# Patient Record
Sex: Female | Born: 1945 | Race: White | Hispanic: No | Smoking: Former smoker
Health system: Southern US, Community
[De-identification: ages and names within clinical notes are randomized; demographics above are authoritative.]

## PROBLEM LIST (undated history)

## (undated) DIAGNOSIS — Z972 Presence of dental prosthetic device (complete) (partial): Secondary | ICD-10-CM

## (undated) DIAGNOSIS — J302 Other seasonal allergic rhinitis: Secondary | ICD-10-CM

## (undated) DIAGNOSIS — J189 Pneumonia, unspecified organism: Secondary | ICD-10-CM

## (undated) DIAGNOSIS — K219 Gastro-esophageal reflux disease without esophagitis: Secondary | ICD-10-CM

## (undated) DIAGNOSIS — J439 Emphysema, unspecified: Secondary | ICD-10-CM

## (undated) DIAGNOSIS — K8050 Calculus of bile duct without cholangitis or cholecystitis without obstruction: Secondary | ICD-10-CM

## (undated) DIAGNOSIS — M1990 Unspecified osteoarthritis, unspecified site: Secondary | ICD-10-CM

## (undated) DIAGNOSIS — J45909 Unspecified asthma, uncomplicated: Secondary | ICD-10-CM

## (undated) DIAGNOSIS — M810 Age-related osteoporosis without current pathological fracture: Secondary | ICD-10-CM

## (undated) DIAGNOSIS — G709 Myoneural disorder, unspecified: Secondary | ICD-10-CM

## (undated) DIAGNOSIS — J42 Unspecified chronic bronchitis: Secondary | ICD-10-CM

## (undated) HISTORY — PX: COLECTOMY: SHX59

## (undated) HISTORY — PX: SALPINGOOPHORECTOMY: SHX82

## (undated) HISTORY — DX: Unspecified osteoarthritis, unspecified site: M19.90

## (undated) HISTORY — DX: Emphysema, unspecified: J43.9

## (undated) HISTORY — PX: SMALL INTESTINE SURGERY: SHX150

## (undated) HISTORY — DX: Myoneural disorder, unspecified: G70.9

## (undated) HISTORY — PX: APPENDECTOMY: SHX54

## (undated) HISTORY — DX: Unspecified asthma, uncomplicated: J45.909

## (undated) HISTORY — DX: Age-related osteoporosis without current pathological fracture: M81.0

---

## 1985-05-25 HISTORY — PX: BREAST BIOPSY: SHX20

## 1998-01-07 ENCOUNTER — Other Ambulatory Visit: Admission: RE | Admit: 1998-01-07 | Discharge: 1998-01-07 | Payer: Self-pay | Admitting: Family Medicine

## 1999-01-22 ENCOUNTER — Other Ambulatory Visit: Admission: RE | Admit: 1999-01-22 | Discharge: 1999-01-22 | Payer: Self-pay | Admitting: Family Medicine

## 2015-05-01 ENCOUNTER — Ambulatory Visit (INDEPENDENT_AMBULATORY_CARE_PROVIDER_SITE_OTHER): Payer: 59 | Admitting: Emergency Medicine

## 2015-05-01 VITALS — BP 120/70 | HR 70 | Temp 98.1°F | Resp 18 | Ht 60.0 in | Wt 130.0 lb

## 2015-05-01 DIAGNOSIS — D649 Anemia, unspecified: Secondary | ICD-10-CM

## 2015-05-01 DIAGNOSIS — M069 Rheumatoid arthritis, unspecified: Secondary | ICD-10-CM | POA: Diagnosis not present

## 2015-05-01 DIAGNOSIS — M2540 Effusion, unspecified joint: Secondary | ICD-10-CM | POA: Diagnosis not present

## 2015-05-01 DIAGNOSIS — N3000 Acute cystitis without hematuria: Secondary | ICD-10-CM

## 2015-05-01 DIAGNOSIS — M7989 Other specified soft tissue disorders: Secondary | ICD-10-CM

## 2015-05-01 LAB — POCT URINALYSIS DIP (MANUAL ENTRY)
BILIRUBIN UA: NEGATIVE
Bilirubin, UA: NEGATIVE
Blood, UA: NEGATIVE
GLUCOSE UA: NEGATIVE
Nitrite, UA: POSITIVE — AB
PROTEIN UA: NEGATIVE
SPEC GRAV UA: 1.015
Urobilinogen, UA: 0.2
pH, UA: 7

## 2015-05-01 LAB — COMPLETE METABOLIC PANEL WITH GFR
ALBUMIN: 3.3 g/dL — AB (ref 3.6–5.1)
ALK PHOS: 114 U/L (ref 33–130)
ALT: 6 U/L (ref 6–29)
AST: 10 U/L (ref 10–35)
BILIRUBIN TOTAL: 0.5 mg/dL (ref 0.2–1.2)
BUN: 13 mg/dL (ref 7–25)
CALCIUM: 9.2 mg/dL (ref 8.6–10.4)
CO2: 25 mmol/L (ref 20–31)
CREATININE: 0.73 mg/dL (ref 0.60–0.93)
Chloride: 102 mmol/L (ref 98–110)
GFR, EST NON AFRICAN AMERICAN: 84 mL/min (ref >=60)
Glucose, Bld: 82 mg/dL (ref 65–99)
Potassium: 4.8 mmol/L (ref 3.5–5.3)
Sodium: 136 mmol/L (ref 135–146)
TOTAL PROTEIN: 7.3 g/dL (ref 6.1–8.1)

## 2015-05-01 LAB — POCT CBC
Granulocyte percent: 55.2 %G (ref 37–80)
HEMATOCRIT: 31.5 % — AB (ref 37.7–47.9)
HEMOGLOBIN: 10.2 g/dL — AB (ref 12.2–16.2)
LYMPH, POC: 0.9 (ref 0.6–3.4)
MCH, POC: 21.3 pg — AB (ref 27–31.2)
MCHC: 32.4 g/dL (ref 31.8–35.4)
MCV: 65.9 fL — AB (ref 80–97)
MID (CBC): 0.3 (ref 0–0.9)
MPV: 5.9 fL (ref 0–99.8)
POC GRANULOCYTE: 1.5 — AB (ref 2–6.9)
POC LYMPH %: 34.3 % (ref 10–50)
POC MID %: 10.5 % (ref 0–12)
Platelet Count, POC: 407 10*3/uL (ref 142–424)
RBC: 4.78 M/uL (ref 4.04–5.48)
RDW, POC: 19 %
WBC: 2.7 10*3/uL — AB (ref 4.6–10.2)

## 2015-05-01 LAB — TSH: TSH: 1.258 u[IU]/mL (ref 0.350–4.500)

## 2015-05-01 LAB — POC MICROSCOPIC URINALYSIS (UMFC)

## 2015-05-01 LAB — IRON AND TIBC
%SAT: 12 % (ref 11–50)
IRON: 26 ug/dL — AB (ref 45–160)
TIBC: 217 ug/dL — ABNORMAL LOW (ref 250–450)
UIBC: 191 ug/dL (ref 125–400)

## 2015-05-01 MED ORDER — CEPHALEXIN 250 MG PO CAPS: 250.0000 mg | ORAL_CAPSULE | Freq: Three times a day (TID) | ORAL | Status: AC

## 2015-05-01 MED ORDER — TRAMADOL HCL ER 100 MG PO TB24: 100.0000 mg | ORAL_TABLET | Freq: Every day | ORAL | Status: DC

## 2015-05-01 MED ORDER — SULINDAC 150 MG PO TABS: 150.0000 mg | ORAL_TABLET | Freq: Two times a day (BID) | ORAL | Status: DC

## 2015-05-01 NOTE — Patient Instructions (Addendum)
I am requesting home health aide at this time.  Please await contact for this information, as well as for rheumatology appointment, and for the doppler of her lower extremities.   I will look at your lab results and follow up with you regarding a diuretic use.   We will start you on an antibiotic at this time.  Follow this prescription. You can attempt to pick up the compression stockings at [facility].

## 2015-05-01 NOTE — Progress Notes (Signed)
[facility] [address] [place] [address] [phone]  Date:  05/01/2015   Name:  Sandra Schwartz   DOB:  1946/01/24   MRN:  [id number]  PCP:  No PCP Per Patient    History of Present Illness:  Sandra Schwartz is a 69 y.o. female patient with pmh of rheumatoid arthritis who presents to [facility] for cc of swelling of feet and hands.  She is here today with daughter. Patient reports that her swelling of her feet and elbows have occurred for 5 years, but has recently worsened.  Patient recently travelled 3 days ago from [place] to move in with daughter.  She was living alone, but daily living had grown difficult.  She no longer drives.  She is not able to open cans and cook, and dress herself, due to the deformity of her hands.  Within the last 2 weeks, walking has become very difficult.  She has tingling of her lower extremity.  Hurts to walk.  She has no sob or dyspnea.  There is no chest pains, palpitations, or dizziness.   . Rheumatoid arthritis treated with clinoril.  She states that she had prednisone for a stint, but developed pneumonia, and this was discontinued.    When asked about congestive heart failure, patient and daughter have never heard this.  She was on a diuretic at a time, but developed dizziness--so this was discontinued.  She currently does not use any compression stockings. No known hx of blood clots.   She reports no blood in stool, but last week, she recalls her stool being very dark. Removed 1 ft of colon due to blockage, 5 years ago.  No personal or familial hx of gi cancers.   Daughter scheduled appt with a primary care on 05/13/2015 with [facility].  Past primary care was [facility] [place], [place] [address], phone: [phone]  No urinary pain, dysuria, hematuria, or frequency at this time.  She does have a hx of urinary tract infection.    There are no active problems to display for this patient.   Past Medical History   Diagnosis Date  . Allergy   . Arthritis   . Asthma   . CHF (congestive heart failure) ([place])   . Emphysema of lung ([place])   . Neuromuscular disorder ([place])   . Osteoporosis     Past Surgical History  Procedure Laterality Date  . Appendectomy    . Colon surgery    . Small intestine surgery      Social History  Substance Use Topics  . Smoking status: Former Smoker    Quit date: 05/01/2007  . Smokeless tobacco: None  . Alcohol Use: No    History reviewed. No pertinent family history.  Allergies  Allergen Reactions  . Codeine Rash    Medication list has been reviewed and updated.  No current outpatient prescriptions on file prior to visit.   No current facility-administered medications on file prior to visit.    ROS ROS otherwise unremarkable unless listed above.  Physical Examination: BP 120/70 mmHg  Pulse 70  Temp(Src) 98.1 F (36.7 C) (Oral)  Resp 18  Ht 5' (1.524 m)  Wt 130 lb (58.968 kg)  BMI 25.39 kg/m2  SpO2 97% Ideal Body Weight: Weight in (lb) to have BMI = 25: 127.7  Physical Exam  Constitutional: She is oriented to person, place, and time. She appears well-developed and well-nourished. No distress.  HENT:  Head: Normocephalic and atraumatic.  Right Ear: External ear normal.  Left Ear: External ear normal.  Eyes: Conjunctivae and EOM are normal. Pupils are equal, round, and reactive to light.  Cardiovascular: Normal rate.  Exam reveals no gallop and no friction rub.   Murmur heard.  Systolic murmur is present with a grade of 1/6  Pulses:      Radial pulses are 2+ on the right side, and 2+ on the left side.       Dorsalis pedis pulses are 1+ on the right side, and 1+ on the left side.  Pitting edema.  Pulmonary/Chest: Effort normal. No respiratory distress.  Musculoskeletal:  mcp swelling with ulnar deviation consistent with RA.  Neurological: She is alert and oriented to person, place, and time.  Skin: She is not diaphoretic.  Psychiatric:  She has a normal mood and affect. Her behavior is normal.     Results for orders placed or performed in visit on 05/01/15  POCT CBC  Result Value Ref Range   WBC 2.7 (A) 4.6 - 10.2 K/uL   Lymph, poc 0.9 0.6 - 3.4   POC LYMPH PERCENT 34.3 10 - 50 %L   MID (cbc) 0.3 0 - 0.9   POC MID % 10.5 0 - 12 %M   POC Granulocyte 1.5 (A) 2 - 6.9   Granulocyte percent 55.2 37 - 80 %G   RBC 4.78 4.04 - 5.48 M/uL   Hemoglobin 10.2 (A) 12.2 - 16.2 g/dL   HCT, POC 31.5 (A) 37.7 - 47.9 %   MCV 65.9 (A) 80 - 97 fL   MCH, POC 21.3 (A) 27 - 31.2 pg   MCHC 32.4 31.8 - 35.4 g/dL   RDW, POC 19.0 %   Platelet Count, POC 407 142 - 424 K/uL   MPV 5.9 0 - 99.8 fL  POCT urinalysis dipstick  Result Value Ref Range   Color, UA orange (A) yellow   Clarity, UA cloudy (A) clear   Glucose, UA negative negative   Bilirubin, UA negative negative   Ketones, POC UA negative negative   Spec Grav, UA 1.015    Blood, UA negative negative   pH, UA 7.0    Protein Ur, POC negative negative   Urobilinogen, UA 0.2    Nitrite, UA Positive (A) Negative   Leukocytes, UA small (1+) (A) Negative  POCT Microscopic Urinalysis ([facility])  Result Value Ref Range   WBC,UR,HPF,POC Moderate (A) None WBC/hpf   RBC,UR,HPF,POC None None RBC/hpf   Bacteria Many (A) None, Too numerous to count   Mucus Present (A) Absent   Epithelial Cells, UR Per Microscopy Few (A) None, Too numerous to count cells/hpf   Amorphous present    EKG normal.  Assessment and Plan: Sandra Schwartz is a 69 y.o. female who is here today for cc of foot and hand swelling.   Pitting edema --BP normal, and concern that diuretic may be too harsh at this time. Treating urinary tract infection.  Urine culture sent. Edema of lower extremity--will schedule doppler for clotting.  This swelling could be secondary to her under treated rheumatoid arthritis.  Will rule out CHF at this time.   Anemia possibly secondary to her RA.  Will advise fecal occult to rule out GI  bleed. Rheumatology consult appreciated.  She likely could use disease modifying drug, but compilation of age and severity of illness, may effect treatment plan.   She declines flu and pneumococcal vaccine at this time.  Will retrieve vaccine record to see what vaccines  have been done.  This will be important to her treatment selection.   Home health aide referral appreciated, to see to current needs Rheumatoid arthritis involving multiple sites, unspecified rheumatoid factor presence ([place]) - Plan: Ambulatory referral to Rheumatology, POCT Microscopic Urinalysis ([facility]), Ambulatory referral to [place], traMADol (ULTRAM-ER) 100 MG 24 hr tablet, sulindac (CLINORIL) 150 MG tablet  Swelling of lower extremity - Plan: POCT CBC, COMPLETE METABOLIC PANEL WITH GFR, EKG 12-Lead, POCT SEDIMENTATION RATE, TSH, VAS [place] LOWER EXTREMITY VENOUS (DVT), POCT urinalysis dipstick, Ambulatory referral to [place], Brain natriuretic peptide  Joint swelling - Plan: POCT SEDIMENTATION RATE, VAS [place] LOWER EXTREMITY VENOUS (DVT), Ambulatory referral to Home Health  Low hemoglobin - Plan: Iron and TIBC  Acute cystitis without hematuria - Plan: cephALEXin (KEFLEX) 250 MG capsule, Urine culture   Ivar Drape, PA-C [facility] 05/01/2015 2:57 PM

## 2015-05-02 LAB — BRAIN NATRIURETIC PEPTIDE: BRAIN NATRIURETIC PEPTIDE: 42.3 pg/mL (ref 0.0–100.0)

## 2015-05-03 ENCOUNTER — Telehealth: Payer: Self-pay | Admitting: Physician Assistant

## 2015-05-03 NOTE — Telephone Encounter (Signed)
Please fax a permission to release information for her medical record.  Patient is moving here.  Please mark as urgent.  [facility] [place], [place] [address] phone number: [phone]

## 2015-05-03 NOTE — Telephone Encounter (Signed)
Request for records faxed with confirmation.

## 2015-05-04 LAB — URINE CULTURE: Colony Count: >=100000

## 2015-05-07 ENCOUNTER — Ambulatory Visit (HOSPITAL_COMMUNITY)
Admission: RE | Admit: 2015-05-07 | Discharge: 2015-05-07 | Disposition: A | Discharge status: Home / Self Care | Payer: Medicaid - Out of State | Source: Ambulatory Visit | Attending: Physician Assistant | Admitting: Physician Assistant

## 2015-05-07 ENCOUNTER — Telehealth: Payer: Self-pay

## 2015-05-07 DIAGNOSIS — M2540 Effusion, unspecified joint: Secondary | ICD-10-CM | POA: Diagnosis not present

## 2015-05-07 DIAGNOSIS — M7122 Synovial cyst of popliteal space [Baker], left knee: Secondary | ICD-10-CM | POA: Insufficient documentation

## 2015-05-07 DIAGNOSIS — M7989 Other specified soft tissue disorders: Secondary | ICD-10-CM | POA: Insufficient documentation

## 2015-05-07 DIAGNOSIS — D649 Anemia, unspecified: Secondary | ICD-10-CM

## 2015-05-07 NOTE — Telephone Encounter (Signed)
[facility] called to deliver results of bilateral venous duplex. Negative for DVT bilaterally.  She has a Baker's cyst on the left popliteal focca.

## 2015-05-07 NOTE — Progress Notes (Signed)
VASCULAR LAB PRELIMINARY  PRELIMINARY  PRELIMINARY  PRELIMINARY  Bilateral lower extremity venous duplex  completed.    Preliminary report:  Bilateral:  No evidence of DVT or superficial thrombosis.  Left:  Baker's cyst noted in the popliteal fossa.    Callan Norden, RVT 05/07/2015, 9:42 AM

## 2015-05-08 ENCOUNTER — Telehealth: Payer: Self-pay | Admitting: Family Medicine

## 2015-05-08 MED ORDER — FUROSEMIDE 20 MG PO TABS: 20.0000 mg | ORAL_TABLET | ORAL | Status: DC

## 2015-05-08 NOTE — Telephone Encounter (Signed)
Merry Proud, nurse with [facility], called to get verabal orders until patient see's PCP Monday. Verbal order given for skilled nursing for disease management, medication education, PT for gait and strength, occupational therapy for arthritis in hands, and tele-monitoring equipment.  Merry Proud # [phone]

## 2015-05-08 NOTE — Telephone Encounter (Signed)
Spoke with patient's daughter who was present at the appointment.  She states that the swelling is going down a lot, but there is still swelling present.  I am advising that they take 20mg  every other day.   I have asked that they return for a cbc and ferritin level within the next week as a labs only.  Also advised that while they are there, they pick up a stool occult which was ordered at last visit.  Please give to patient.   Rheumatology visit tomorrow.  Advised that if rheumatologist states to disregard the lasix, this is fine.   She will have new pcp appt. On 05/13/2015

## 2015-05-09 ENCOUNTER — Other Ambulatory Visit (INDEPENDENT_AMBULATORY_CARE_PROVIDER_SITE_OTHER): Payer: 59

## 2015-05-09 DIAGNOSIS — D649 Anemia, unspecified: Secondary | ICD-10-CM | POA: Diagnosis not present

## 2015-05-09 LAB — FERRITIN: Ferritin: 241 ng/mL (ref 10–291)

## 2015-05-09 LAB — CBC
HCT: 31.3 % — ABNORMAL LOW (ref 36.0–46.0)
HEMOGLOBIN: 10.1 g/dL — AB (ref 12.0–15.0)
MCH: 21.5 pg — AB (ref 26.0–34.0)
MCHC: 32.3 g/dL (ref 30.0–36.0)
MCV: 66.7 fL — AB (ref 78.0–100.0)
MPV: 7.9 fL — ABNORMAL LOW (ref 8.6–12.4)
Platelets: 335 10*3/uL (ref 150–400)
RBC: 4.69 MIL/uL (ref 3.87–5.11)
RDW: 19.2 % — ABNORMAL HIGH (ref 11.5–15.5)
WBC: 3.2 10*3/uL — ABNORMAL LOW (ref 4.0–10.5)

## 2015-05-09 NOTE — Telephone Encounter (Signed)
Agree to verbal order for all home health needs as outlined.

## 2015-05-13 ENCOUNTER — Ambulatory Visit: Payer: Self-pay | Admitting: Internal Medicine

## 2015-05-23 LAB — POC HEMOCCULT BLD/STL (HOME/3-CARD/SCREEN)
Card #2 Fecal Occult Blod, POC: NEGATIVE
Card #3 Fecal Occult Blood, POC: NEGATIVE
Fecal Occult Blood, POC: NEGATIVE

## 2015-06-05 ENCOUNTER — Telehealth: Payer: Self-pay

## 2015-06-05 NOTE — Telephone Encounter (Signed)
Marlowe Kays from [facility] from home health would like to get an order for pt to have Occup therapy for once a week for 2 weeks. Please call [phone]

## 2015-06-06 ENCOUNTER — Ambulatory Visit: Payer: 59

## 2015-06-06 NOTE — Telephone Encounter (Signed)
Please go ahead and refer to occupational therapy for 2 weeks--

## 2015-06-07 NOTE — Telephone Encounter (Signed)
Spoke with Marlowe Kays. Gave verbal orders.

## 2015-06-13 ENCOUNTER — Encounter: Payer: Self-pay | Admitting: Physician Assistant

## 2015-06-13 ENCOUNTER — Ambulatory Visit: Payer: 59 | Admitting: Internal Medicine

## 2015-06-13 DIAGNOSIS — E785 Hyperlipidemia, unspecified: Secondary | ICD-10-CM | POA: Insufficient documentation

## 2015-06-13 DIAGNOSIS — N952 Postmenopausal atrophic vaginitis: Secondary | ICD-10-CM | POA: Insufficient documentation

## 2015-06-13 DIAGNOSIS — F411 Generalized anxiety disorder: Secondary | ICD-10-CM | POA: Insufficient documentation

## 2015-06-13 DIAGNOSIS — J449 Chronic obstructive pulmonary disease, unspecified: Secondary | ICD-10-CM | POA: Insufficient documentation

## 2015-06-13 DIAGNOSIS — M069 Rheumatoid arthritis, unspecified: Secondary | ICD-10-CM | POA: Insufficient documentation

## 2015-06-13 DIAGNOSIS — E079 Disorder of thyroid, unspecified: Secondary | ICD-10-CM | POA: Insufficient documentation

## 2015-06-13 DIAGNOSIS — I10 Essential (primary) hypertension: Secondary | ICD-10-CM | POA: Insufficient documentation

## 2015-06-25 ENCOUNTER — Telehealth: Payer: Self-pay

## 2015-06-25 NOTE — Telephone Encounter (Signed)
Sandra Schwartz from [facility] is calling to request an order for cardio pulmonary care twice a week. She states that the program is almost over and the insurance has changes. Tamsen Meek: [phone]

## 2015-06-25 NOTE — Telephone Encounter (Signed)
She had a visit with [facility] planned before she ever saw [place].  They follow her care.  This should be their orders, and we should assist them in getting this ordered through them, and with a change in insurance--this is a good time for this transition.  Contact [facility], and let them know their desires.  In this way, a continuity of care will be done as this is her primary from what I understand.

## 2015-06-27 ENCOUNTER — Ambulatory Visit (INDEPENDENT_AMBULATORY_CARE_PROVIDER_SITE_OTHER): Payer: Medicare Other | Admitting: Internal Medicine

## 2015-06-27 ENCOUNTER — Encounter: Payer: Self-pay | Admitting: Internal Medicine

## 2015-06-27 VITALS — BP 130/70 | HR 77 | Temp 98.3°F | Resp 12 | Ht 61.0 in | Wt 134.1 lb

## 2015-06-27 DIAGNOSIS — M069 Rheumatoid arthritis, unspecified: Secondary | ICD-10-CM | POA: Diagnosis not present

## 2015-06-27 DIAGNOSIS — J42 Unspecified chronic bronchitis: Secondary | ICD-10-CM

## 2015-06-27 MED ORDER — TRAMADOL HCL ER 100 MG PO TB24
100.0000 mg | ORAL_TABLET | Freq: Every day | ORAL | Status: DC
Start: 2015-06-27 — End: 2016-06-19

## 2015-06-27 MED ORDER — DOXYCYCLINE HYCLATE 100 MG PO TABS: 100.0000 mg | ORAL_TABLET | Freq: Two times a day (BID) | ORAL | Status: DC

## 2015-06-27 NOTE — Patient Instructions (Signed)
We have given you the prescription for the tramadol.   We have also sent in the antibiotic for the sinuses called doxycycline 1 pill twice a day for 10 days to clear the sinuses.   You can also use a medicine over the counter called zyrtec (cetirizine).

## 2015-06-27 NOTE — Progress Notes (Signed)
Pre visit review using our clinic review tool, if applicable. No additional management support is needed unless otherwise documented below in the visit note. 

## 2015-06-28 ENCOUNTER — Encounter: Payer: Self-pay | Admitting: Internal Medicine

## 2015-06-28 NOTE — Progress Notes (Signed)
   Subjective:    Patient ID: Sandra Schwartz, female    DOB: 1945/12/04, 70 y.o.   MRN: [id number]  HPI The patient is a new 70 YO female coming in for her rheumatoid arthritis. She has seen rheumatology but there was some discrepancy with her insurance and she was delayed in starting DMARD. She is on prednisone right now. She was having severe hand pain. This is somewhat better now. She does have deformity from years of disease. Usually an NSAID will help with her pain but she is not allowed to use that while on prednisone.   PMH, [facility], social history reviewed and updated.   Review of Systems  Constitutional: Positive for activity change. Negative for fever, chills, appetite change, fatigue and unexpected weight change.  HENT: Negative.   Eyes: Negative.   Respiratory: Negative for cough, chest tightness, shortness of breath and wheezing.   Cardiovascular: Negative for chest pain, palpitations and leg swelling.  Gastrointestinal: Negative for nausea, abdominal pain, diarrhea, constipation and abdominal distention.  Musculoskeletal: Positive for joint swelling and arthralgias. Negative for myalgias, back pain and gait problem.  Skin: Negative.   Neurological: Negative.   Psychiatric/Behavioral: Negative.       Objective:   Physical Exam  Constitutional: She is oriented to person, place, and time. She appears well-developed and well-nourished.  HENT:  Head: Normocephalic and atraumatic.  Eyes: EOM are normal.  Neck: Normal range of motion.  Cardiovascular: Normal rate and regular rhythm.   Pulmonary/Chest: Effort normal and breath sounds normal. No respiratory distress. She has no wheezes.  Abdominal: Soft. Bowel sounds are normal. She exhibits no distension. There is no tenderness. There is no rebound.  Musculoskeletal: She exhibits tenderness. She exhibits no edema.  Stigmata of RA hand changes  Neurological: She is alert and oriented to person, place, and time. Coordination normal.    Skin: Skin is warm and dry.  Psychiatric: She has a normal mood and affect.   Filed Vitals:   06/27/15 1402  BP: 130/70  Pulse: 77  Temp: 98.3 F (36.8 C)  TempSrc: Oral  Resp: 12  Height: 5\' 1"  (1.549 m)  Weight: 134 lb 1.9 oz (60.836 kg)  SpO2: 98%      Assessment & Plan:

## 2015-06-28 NOTE — Assessment & Plan Note (Signed)
Some flare today and rx for doxycycline 10 days to help. Already on prednisone for her RA. Has rescue inhalers and if more flares would need maintenance meds.

## 2015-06-28 NOTE — Assessment & Plan Note (Signed)
She will keep follow up with rheumatology for her RA and DMARD. Refill of tramadol ER until she can see them back.

## 2015-07-02 DIAGNOSIS — I509 Heart failure, unspecified: Secondary | ICD-10-CM | POA: Diagnosis not present

## 2015-07-02 DIAGNOSIS — M0589 Other rheumatoid arthritis with rheumatoid factor of multiple sites: Secondary | ICD-10-CM | POA: Diagnosis not present

## 2015-07-02 DIAGNOSIS — I0981 Rheumatic heart failure: Secondary | ICD-10-CM | POA: Diagnosis not present

## 2015-07-02 DIAGNOSIS — J439 Emphysema, unspecified: Secondary | ICD-10-CM | POA: Diagnosis not present

## 2015-07-05 ENCOUNTER — Telehealth: Payer: Self-pay | Admitting: Internal Medicine

## 2015-07-05 DIAGNOSIS — J439 Emphysema, unspecified: Secondary | ICD-10-CM | POA: Diagnosis not present

## 2015-07-05 DIAGNOSIS — I0981 Rheumatic heart failure: Secondary | ICD-10-CM | POA: Diagnosis not present

## 2015-07-05 DIAGNOSIS — M0589 Other rheumatoid arthritis with rheumatoid factor of multiple sites: Secondary | ICD-10-CM | POA: Diagnosis not present

## 2015-07-05 DIAGNOSIS — I509 Heart failure, unspecified: Secondary | ICD-10-CM | POA: Diagnosis not present

## 2015-07-05 NOTE — Telephone Encounter (Signed)
Ria Comment from [facility]  Last few time she has went to see pt her bp keeps going up.    bp 140/80 and 155/100

## 2015-07-08 NOTE — Telephone Encounter (Signed)
Daughter believes that prednisone is messing patients BP up.  Patients will be seeing Dr.Truslow on the 23rd to follow up on this.  States weight has gone back down to normal and that her BP is not back in order. States patient did not feel too could yesterday because of the weather change.  States it was hard for patient to get up and move because her muscles was hurting.  Gave patient a hot shower, muscle massage and did give patient two soma's(prescirbed to son in law)  8 hours apart.  States tramadol does not seem to be helping patient at all.  Will schedule follow up.

## 2015-07-08 NOTE — Telephone Encounter (Signed)
Please call daughter back in regards.  Please call back after 11:40am.

## 2015-07-08 NOTE — Telephone Encounter (Signed)
Spoke to patient's daughter. They will monitor the bp and call us if it remains high.

## 2015-07-08 NOTE — Telephone Encounter (Signed)
Have them continue to follow the BP.

## 2015-07-08 NOTE — Telephone Encounter (Signed)
Left message for patient to call back. The number provided to reach Olin from [facility] is not accepting calls at this time.

## 2015-07-09 DIAGNOSIS — M0589 Other rheumatoid arthritis with rheumatoid factor of multiple sites: Secondary | ICD-10-CM | POA: Diagnosis not present

## 2015-07-09 DIAGNOSIS — J439 Emphysema, unspecified: Secondary | ICD-10-CM | POA: Diagnosis not present

## 2015-07-09 DIAGNOSIS — I509 Heart failure, unspecified: Secondary | ICD-10-CM | POA: Diagnosis not present

## 2015-07-09 DIAGNOSIS — I0981 Rheumatic heart failure: Secondary | ICD-10-CM | POA: Diagnosis not present

## 2015-07-15 ENCOUNTER — Ambulatory Visit: Payer: Medicare Other | Admitting: Internal Medicine

## 2015-07-15 DIAGNOSIS — J439 Emphysema, unspecified: Secondary | ICD-10-CM | POA: Diagnosis not present

## 2015-07-15 DIAGNOSIS — M0589 Other rheumatoid arthritis with rheumatoid factor of multiple sites: Secondary | ICD-10-CM | POA: Diagnosis not present

## 2015-07-15 DIAGNOSIS — I509 Heart failure, unspecified: Secondary | ICD-10-CM | POA: Diagnosis not present

## 2015-07-15 DIAGNOSIS — I0981 Rheumatic heart failure: Secondary | ICD-10-CM | POA: Diagnosis not present

## 2015-07-18 DIAGNOSIS — M25442 Effusion, left hand: Secondary | ICD-10-CM | POA: Diagnosis not present

## 2015-07-18 DIAGNOSIS — M0570 Rheumatoid arthritis with rheumatoid factor of unspecified site without organ or systems involvement: Secondary | ICD-10-CM | POA: Diagnosis not present

## 2015-07-18 DIAGNOSIS — M25511 Pain in right shoulder: Secondary | ICD-10-CM | POA: Diagnosis not present

## 2015-07-18 DIAGNOSIS — M25441 Effusion, right hand: Secondary | ICD-10-CM | POA: Diagnosis not present

## 2015-07-18 DIAGNOSIS — L309 Dermatitis, unspecified: Secondary | ICD-10-CM | POA: Diagnosis not present

## 2015-07-19 DIAGNOSIS — I0981 Rheumatic heart failure: Secondary | ICD-10-CM | POA: Diagnosis not present

## 2015-07-19 DIAGNOSIS — J439 Emphysema, unspecified: Secondary | ICD-10-CM | POA: Diagnosis not present

## 2015-07-19 DIAGNOSIS — M0589 Other rheumatoid arthritis with rheumatoid factor of multiple sites: Secondary | ICD-10-CM | POA: Diagnosis not present

## 2015-07-19 DIAGNOSIS — I509 Heart failure, unspecified: Secondary | ICD-10-CM | POA: Diagnosis not present

## 2015-07-22 DIAGNOSIS — J439 Emphysema, unspecified: Secondary | ICD-10-CM | POA: Diagnosis not present

## 2015-07-22 DIAGNOSIS — I509 Heart failure, unspecified: Secondary | ICD-10-CM | POA: Diagnosis not present

## 2015-07-22 DIAGNOSIS — I0981 Rheumatic heart failure: Secondary | ICD-10-CM | POA: Diagnosis not present

## 2015-07-22 DIAGNOSIS — M0589 Other rheumatoid arthritis with rheumatoid factor of multiple sites: Secondary | ICD-10-CM | POA: Diagnosis not present

## 2015-07-23 ENCOUNTER — Telehealth: Payer: Self-pay

## 2015-07-23 NOTE — Telephone Encounter (Signed)
Home Health Cert/Plan of Care received (07/02/2015 - 08/30/2015) and placed on MD's desk for signature

## 2015-07-24 NOTE — Telephone Encounter (Signed)
Paperwork signed, faxed, copy sent to scan 

## 2015-07-30 DIAGNOSIS — M0589 Other rheumatoid arthritis with rheumatoid factor of multiple sites: Secondary | ICD-10-CM | POA: Diagnosis not present

## 2015-07-30 DIAGNOSIS — I509 Heart failure, unspecified: Secondary | ICD-10-CM | POA: Diagnosis not present

## 2015-07-30 DIAGNOSIS — J439 Emphysema, unspecified: Secondary | ICD-10-CM | POA: Diagnosis not present

## 2015-07-30 DIAGNOSIS — I0981 Rheumatic heart failure: Secondary | ICD-10-CM | POA: Diagnosis not present

## 2015-08-07 DIAGNOSIS — M1990 Unspecified osteoarthritis, unspecified site: Secondary | ICD-10-CM | POA: Diagnosis not present

## 2015-08-07 DIAGNOSIS — Z87891 Personal history of nicotine dependence: Secondary | ICD-10-CM | POA: Diagnosis not present

## 2015-08-07 DIAGNOSIS — J45909 Unspecified asthma, uncomplicated: Secondary | ICD-10-CM | POA: Diagnosis not present

## 2015-08-07 DIAGNOSIS — M0589 Other rheumatoid arthritis with rheumatoid factor of multiple sites: Secondary | ICD-10-CM | POA: Diagnosis not present

## 2015-08-07 DIAGNOSIS — Z79891 Long term (current) use of opiate analgesic: Secondary | ICD-10-CM | POA: Diagnosis not present

## 2015-08-07 DIAGNOSIS — Z791 Long term (current) use of non-steroidal anti-inflammatories (NSAID): Secondary | ICD-10-CM | POA: Diagnosis not present

## 2015-08-07 DIAGNOSIS — J439 Emphysema, unspecified: Secondary | ICD-10-CM | POA: Diagnosis not present

## 2015-08-07 DIAGNOSIS — M810 Age-related osteoporosis without current pathological fracture: Secondary | ICD-10-CM | POA: Diagnosis not present

## 2015-08-07 DIAGNOSIS — G709 Myoneural disorder, unspecified: Secondary | ICD-10-CM | POA: Diagnosis not present

## 2015-08-07 DIAGNOSIS — Z792 Long term (current) use of antibiotics: Secondary | ICD-10-CM | POA: Diagnosis not present

## 2015-08-07 DIAGNOSIS — I509 Heart failure, unspecified: Secondary | ICD-10-CM | POA: Diagnosis not present

## 2015-08-07 DIAGNOSIS — Z9181 History of falling: Secondary | ICD-10-CM | POA: Diagnosis not present

## 2015-08-07 DIAGNOSIS — I0981 Rheumatic heart failure: Secondary | ICD-10-CM | POA: Diagnosis not present

## 2015-08-15 DIAGNOSIS — Z9181 History of falling: Secondary | ICD-10-CM | POA: Diagnosis not present

## 2015-08-15 DIAGNOSIS — I509 Heart failure, unspecified: Secondary | ICD-10-CM | POA: Diagnosis not present

## 2015-08-15 DIAGNOSIS — J439 Emphysema, unspecified: Secondary | ICD-10-CM | POA: Diagnosis not present

## 2015-08-15 DIAGNOSIS — Z791 Long term (current) use of non-steroidal anti-inflammatories (NSAID): Secondary | ICD-10-CM | POA: Diagnosis not present

## 2015-08-15 DIAGNOSIS — Z792 Long term (current) use of antibiotics: Secondary | ICD-10-CM | POA: Diagnosis not present

## 2015-08-15 DIAGNOSIS — M810 Age-related osteoporosis without current pathological fracture: Secondary | ICD-10-CM | POA: Diagnosis not present

## 2015-08-15 DIAGNOSIS — M1990 Unspecified osteoarthritis, unspecified site: Secondary | ICD-10-CM | POA: Diagnosis not present

## 2015-08-15 DIAGNOSIS — Z87891 Personal history of nicotine dependence: Secondary | ICD-10-CM | POA: Diagnosis not present

## 2015-08-15 DIAGNOSIS — G709 Myoneural disorder, unspecified: Secondary | ICD-10-CM | POA: Diagnosis not present

## 2015-08-15 DIAGNOSIS — Z79891 Long term (current) use of opiate analgesic: Secondary | ICD-10-CM | POA: Diagnosis not present

## 2015-08-15 DIAGNOSIS — M0589 Other rheumatoid arthritis with rheumatoid factor of multiple sites: Secondary | ICD-10-CM | POA: Diagnosis not present

## 2015-08-15 DIAGNOSIS — I0981 Rheumatic heart failure: Secondary | ICD-10-CM | POA: Diagnosis not present

## 2015-08-15 DIAGNOSIS — J45909 Unspecified asthma, uncomplicated: Secondary | ICD-10-CM | POA: Diagnosis not present

## 2015-08-20 DIAGNOSIS — M0589 Other rheumatoid arthritis with rheumatoid factor of multiple sites: Secondary | ICD-10-CM | POA: Diagnosis not present

## 2015-08-20 DIAGNOSIS — G709 Myoneural disorder, unspecified: Secondary | ICD-10-CM | POA: Diagnosis not present

## 2015-08-20 DIAGNOSIS — Z9181 History of falling: Secondary | ICD-10-CM | POA: Diagnosis not present

## 2015-08-20 DIAGNOSIS — Z792 Long term (current) use of antibiotics: Secondary | ICD-10-CM | POA: Diagnosis not present

## 2015-08-20 DIAGNOSIS — Z791 Long term (current) use of non-steroidal anti-inflammatories (NSAID): Secondary | ICD-10-CM | POA: Diagnosis not present

## 2015-08-20 DIAGNOSIS — I0981 Rheumatic heart failure: Secondary | ICD-10-CM | POA: Diagnosis not present

## 2015-08-20 DIAGNOSIS — I509 Heart failure, unspecified: Secondary | ICD-10-CM | POA: Diagnosis not present

## 2015-08-20 DIAGNOSIS — M1990 Unspecified osteoarthritis, unspecified site: Secondary | ICD-10-CM | POA: Diagnosis not present

## 2015-08-20 DIAGNOSIS — J439 Emphysema, unspecified: Secondary | ICD-10-CM | POA: Diagnosis not present

## 2015-08-20 DIAGNOSIS — J45909 Unspecified asthma, uncomplicated: Secondary | ICD-10-CM | POA: Diagnosis not present

## 2015-08-20 DIAGNOSIS — M810 Age-related osteoporosis without current pathological fracture: Secondary | ICD-10-CM | POA: Diagnosis not present

## 2015-08-20 DIAGNOSIS — Z87891 Personal history of nicotine dependence: Secondary | ICD-10-CM | POA: Diagnosis not present

## 2015-08-20 DIAGNOSIS — Z79891 Long term (current) use of opiate analgesic: Secondary | ICD-10-CM | POA: Diagnosis not present

## 2015-08-21 ENCOUNTER — Other Ambulatory Visit: Payer: Self-pay | Admitting: *Deleted

## 2015-08-21 DIAGNOSIS — M25442 Effusion, left hand: Secondary | ICD-10-CM | POA: Diagnosis not present

## 2015-08-21 DIAGNOSIS — N390 Urinary tract infection, site not specified: Secondary | ICD-10-CM | POA: Diagnosis not present

## 2015-08-21 DIAGNOSIS — M0570 Rheumatoid arthritis with rheumatoid factor of unspecified site without organ or systems involvement: Secondary | ICD-10-CM | POA: Diagnosis not present

## 2015-08-21 DIAGNOSIS — M25441 Effusion, right hand: Secondary | ICD-10-CM | POA: Diagnosis not present

## 2015-08-21 DIAGNOSIS — L309 Dermatitis, unspecified: Secondary | ICD-10-CM | POA: Diagnosis not present

## 2015-08-21 DIAGNOSIS — Z79899 Other long term (current) drug therapy: Secondary | ICD-10-CM | POA: Diagnosis not present

## 2015-08-21 MED ORDER — TIOTROPIUM BROMIDE MONOHYDRATE 18 MCG IN CAPS: 18.0000 ug | ORAL_CAPSULE | Freq: Every day | RESPIRATORY_TRACT | Status: DC

## 2015-08-28 DIAGNOSIS — M1990 Unspecified osteoarthritis, unspecified site: Secondary | ICD-10-CM | POA: Diagnosis not present

## 2015-08-28 DIAGNOSIS — Z792 Long term (current) use of antibiotics: Secondary | ICD-10-CM | POA: Diagnosis not present

## 2015-08-28 DIAGNOSIS — Z9181 History of falling: Secondary | ICD-10-CM | POA: Diagnosis not present

## 2015-08-28 DIAGNOSIS — J45909 Unspecified asthma, uncomplicated: Secondary | ICD-10-CM | POA: Diagnosis not present

## 2015-08-28 DIAGNOSIS — Z87891 Personal history of nicotine dependence: Secondary | ICD-10-CM | POA: Diagnosis not present

## 2015-08-28 DIAGNOSIS — Z79891 Long term (current) use of opiate analgesic: Secondary | ICD-10-CM | POA: Diagnosis not present

## 2015-08-28 DIAGNOSIS — I0981 Rheumatic heart failure: Secondary | ICD-10-CM | POA: Diagnosis not present

## 2015-08-28 DIAGNOSIS — J439 Emphysema, unspecified: Secondary | ICD-10-CM | POA: Diagnosis not present

## 2015-08-28 DIAGNOSIS — Z791 Long term (current) use of non-steroidal anti-inflammatories (NSAID): Secondary | ICD-10-CM | POA: Diagnosis not present

## 2015-08-28 DIAGNOSIS — I509 Heart failure, unspecified: Secondary | ICD-10-CM | POA: Diagnosis not present

## 2015-08-28 DIAGNOSIS — M0589 Other rheumatoid arthritis with rheumatoid factor of multiple sites: Secondary | ICD-10-CM | POA: Diagnosis not present

## 2015-08-28 DIAGNOSIS — G709 Myoneural disorder, unspecified: Secondary | ICD-10-CM | POA: Diagnosis not present

## 2015-08-28 DIAGNOSIS — M810 Age-related osteoporosis without current pathological fracture: Secondary | ICD-10-CM | POA: Diagnosis not present

## 2015-09-26 DIAGNOSIS — M0570 Rheumatoid arthritis with rheumatoid factor of unspecified site without organ or systems involvement: Secondary | ICD-10-CM | POA: Diagnosis not present

## 2015-09-26 DIAGNOSIS — M25442 Effusion, left hand: Secondary | ICD-10-CM | POA: Diagnosis not present

## 2015-09-26 DIAGNOSIS — M25441 Effusion, right hand: Secondary | ICD-10-CM | POA: Diagnosis not present

## 2015-09-26 DIAGNOSIS — M79671 Pain in right foot: Secondary | ICD-10-CM | POA: Diagnosis not present

## 2015-09-26 DIAGNOSIS — M79672 Pain in left foot: Secondary | ICD-10-CM | POA: Diagnosis not present

## 2015-10-29 DIAGNOSIS — M0570 Rheumatoid arthritis with rheumatoid factor of unspecified site without organ or systems involvement: Secondary | ICD-10-CM | POA: Diagnosis not present

## 2015-10-29 DIAGNOSIS — R700 Elevated erythrocyte sedimentation rate: Secondary | ICD-10-CM | POA: Diagnosis not present

## 2015-11-18 DIAGNOSIS — D649 Anemia, unspecified: Secondary | ICD-10-CM | POA: Diagnosis not present

## 2015-11-18 DIAGNOSIS — R700 Elevated erythrocyte sedimentation rate: Secondary | ICD-10-CM | POA: Diagnosis not present

## 2015-11-18 DIAGNOSIS — M25442 Effusion, left hand: Secondary | ICD-10-CM | POA: Diagnosis not present

## 2015-11-18 DIAGNOSIS — M0570 Rheumatoid arthritis with rheumatoid factor of unspecified site without organ or systems involvement: Secondary | ICD-10-CM | POA: Diagnosis not present

## 2015-11-18 DIAGNOSIS — M25441 Effusion, right hand: Secondary | ICD-10-CM | POA: Diagnosis not present

## 2015-12-23 ENCOUNTER — Ambulatory Visit: Payer: Medicare Other | Admitting: Internal Medicine

## 2015-12-24 ENCOUNTER — Telehealth: Payer: Self-pay | Admitting: Physician Assistant

## 2015-12-24 NOTE — Telephone Encounter (Signed)
Can you ask her if she is having her hemoglobin checked.  She was seen by the rheumatologist for low hemoglobin 10/29/15.  I do not see that this was followed up.  Is she seeing an internal medicine doctor elsewhere?

## 2016-01-02 ENCOUNTER — Encounter: Payer: Self-pay | Admitting: Internal Medicine

## 2016-01-02 ENCOUNTER — Ambulatory Visit (INDEPENDENT_AMBULATORY_CARE_PROVIDER_SITE_OTHER): Payer: Medicare Other | Admitting: Internal Medicine

## 2016-01-02 DIAGNOSIS — R21 Rash and other nonspecific skin eruption: Secondary | ICD-10-CM

## 2016-01-02 MED ORDER — TRIAMCINOLONE ACETONIDE 0.1 % EX CREA: 1.0000 "application " | TOPICAL_CREAM | Freq: Two times a day (BID) | CUTANEOUS | 0 refills | Status: DC

## 2016-01-02 NOTE — Patient Instructions (Signed)
We do not need labs today.   Dr. Gavin Pound is a rheumatologist in town.   We have sent in triamcinolone cream for the rash.

## 2016-01-02 NOTE — Progress Notes (Signed)
Pre visit review using our clinic review tool, if applicable. No additional management support is needed unless otherwise documented below in the visit note. 

## 2016-01-03 DIAGNOSIS — R21 Rash and other nonspecific skin eruption: Secondary | ICD-10-CM | POA: Insufficient documentation

## 2016-01-03 NOTE — Assessment & Plan Note (Signed)
Rx for triamcinolone cream for the rash. Is likely some dermatitis which is responsive to prednisone. We had a discussion about the fact that she should not change her prednisone dosing based on her rash.

## 2016-01-03 NOTE — Progress Notes (Signed)
   Subjective:    Patient ID: Sandra Schwartz, female    DOB: 20-Jun-1945, 70 y.o.   MRN: [id number]  HPI The patient is a 69 YO female coming in for rash on her right hip. Started a couple weeks ago. It tends to get better and worse when her rheumatologist changes her prednisone. She is now down to 5 mg prednisone and they are not sure if they should increase it for the rash. Her joints are doing well at the moment. No change to soap or detergent. Her rheumatologist just checked labs and they were fine.   Review of Systems  Constitutional: Positive for activity change. Negative for appetite change, chills, fatigue, fever and unexpected weight change.  Respiratory: Negative for cough, chest tightness, shortness of breath and wheezing.   Cardiovascular: Negative for chest pain, palpitations and leg swelling.  Gastrointestinal: Negative for abdominal distention, abdominal pain, constipation, diarrhea and nausea.  Musculoskeletal: Positive for arthralgias. Negative for back pain, gait problem, joint swelling and myalgias.  Skin: Positive for rash.  Neurological: Negative.       Objective:   Physical Exam  Constitutional: She is oriented to person, place, and time. She appears well-developed and well-nourished.  HENT:  Head: Normocephalic and atraumatic.  Eyes: EOM are normal.  Neck: Normal range of motion.  Cardiovascular: Normal rate and regular rhythm.   Pulmonary/Chest: Effort normal and breath sounds normal. No respiratory distress. She has no wheezes.  Abdominal: Soft. Bowel sounds are normal. She exhibits no distension. There is no tenderness. There is no rebound.  Musculoskeletal: She exhibits tenderness. She exhibits no edema.  Stigmata of RA hand changes  Neurological: She is alert and oriented to person, place, and time. Coordination normal.  Skin: Skin is warm and dry.  She declines exam of the rash.   Psychiatric: She has a normal mood and affect.   Vitals:   01/02/16 1451  BP:  (!) 142/80  Pulse: 66  Resp: 16  Temp: 98.8 F (37.1 C)  TempSrc: Oral  SpO2: 97%  Weight: 145 lb (65.8 kg)  Height: 5\' 1"  (1.549 m)      Assessment & Plan:

## 2016-01-20 NOTE — Telephone Encounter (Signed)
Spoke with pt, she states she sees Dr. Sharlet Salina at [facility] and she is following up on this.

## 2016-01-31 ENCOUNTER — Emergency Department (HOSPITAL_COMMUNITY)
Admission: EM | Admit: 2016-01-31 | Discharge: 2016-01-31 | Disposition: A | Discharge status: Home / Self Care | Payer: Medicare Other | Attending: Emergency Medicine | Admitting: Emergency Medicine

## 2016-01-31 ENCOUNTER — Encounter (HOSPITAL_COMMUNITY): Payer: Self-pay

## 2016-01-31 DIAGNOSIS — J45909 Unspecified asthma, uncomplicated: Secondary | ICD-10-CM | POA: Diagnosis not present

## 2016-01-31 DIAGNOSIS — N611 Abscess of the breast and nipple: Secondary | ICD-10-CM | POA: Diagnosis not present

## 2016-01-31 DIAGNOSIS — I110 Hypertensive heart disease with heart failure: Secondary | ICD-10-CM | POA: Diagnosis not present

## 2016-01-31 DIAGNOSIS — N61 Mastitis without abscess: Secondary | ICD-10-CM | POA: Diagnosis not present

## 2016-01-31 DIAGNOSIS — J449 Chronic obstructive pulmonary disease, unspecified: Secondary | ICD-10-CM | POA: Insufficient documentation

## 2016-01-31 DIAGNOSIS — Z87891 Personal history of nicotine dependence: Secondary | ICD-10-CM | POA: Diagnosis not present

## 2016-01-31 DIAGNOSIS — Z79899 Other long term (current) drug therapy: Secondary | ICD-10-CM | POA: Diagnosis not present

## 2016-01-31 DIAGNOSIS — I509 Heart failure, unspecified: Secondary | ICD-10-CM | POA: Diagnosis not present

## 2016-01-31 DIAGNOSIS — L0291 Cutaneous abscess, unspecified: Secondary | ICD-10-CM

## 2016-01-31 DIAGNOSIS — L0390 Cellulitis, unspecified: Secondary | ICD-10-CM

## 2016-01-31 MED ORDER — LIDOCAINE HCL 1 % IJ SOLN
2.0000 mL | Freq: Once | INTRAMUSCULAR | Status: AC
  Administered 2016-01-31: 2 mL
  Filled 2016-01-31: qty 20

## 2016-01-31 MED ORDER — CEPHALEXIN 500 MG PO CAPS: 500.0000 mg | ORAL_CAPSULE | Freq: Two times a day (BID) | ORAL | 0 refills | Status: DC

## 2016-01-31 NOTE — ED Provider Notes (Signed)
[facility] DEPT Provider Note   CSN: [id number] Arrival date & time: 01/31/16  1038   By signing my name below, I, Sandra Schwartz, attest that this documentation has been prepared under the direction and in the presence of Sandra Filler, PA-C. Electronically Signed: [name], Scribe. 01/31/2016. 1:04 PM.   History   Chief Complaint Chief Complaint  Patient presents with  . Abscess    RIGHT BREAST     The history is provided by the patient. No language interpreter was used.   HPI Comments:  Sandra Schwartz is a 70 y.o. female with h/o arthritis, asthma, CHF, and osteoporosis who presents to the Emergency Department complaining of an abscess on her R breast onset 1 week. Pt reports purulent, malodorous drainage that occurred in the shower a few days ago. Pt states that when the abscess began draining some of the pain was relieved. Pt describes the abscess as red, sore, hard and 3/10 pain. Her pain is exacerbated when moving. Pt notes that she has had cysts in the past. No h/o MRSA. Pt used antibiotic ointment with no relief. Pt notes a known allergy to codeine. Pt denies fever, chest pain, SOB, N/V, or discharge from nipples.   Past Medical History:  Diagnosis Date  . Allergy   . Arthritis   . Asthma   . CHF (congestive heart failure) ([place])   . Emphysema of lung ([place])   . Neuromuscular disorder ([place])   . Osteoporosis     Patient Active Problem List   Diagnosis Date Noted  . Rash and nonspecific skin eruption 01/03/2016  . COPD (chronic obstructive pulmonary disease) ([place]) 06/13/2015  . Generalized anxiety disorder 06/13/2015  . Hyperlipidemia 06/13/2015  . Rheumatoid arthritis ([place]) 06/13/2015  . Thyroid disorder 06/13/2015  . Essential hypertension 06/13/2015  . Atrophic vaginitis 06/13/2015    Past Surgical History:  Procedure Laterality Date  . APPENDECTOMY    . COLON SURGERY    . SMALL INTESTINE SURGERY      OB History    No data available       Home  Medications    Prior to Admission medications   Medication Sig Start Date End Date Taking? Authorizing Provider  Ascorbic Acid (VITAMIN C) 100 MG tablet Take 100 mg by mouth daily.    Historical Provider, MD  calcium-vitamin D 250-100 MG-UNIT tablet Take 1 tablet by mouth 2 (two) times daily.    Historical Provider, MD  cephALEXin (KEFLEX) 500 MG capsule Take 1 capsule (500 mg total) by mouth 2 (two) times daily. 01/31/16   Roxanna Mew, PA-C  folic acid (FOLVITE) 1 MG tablet Take 1 mg by mouth daily.    Historical Provider, MD  predniSONE (DELTASONE) 10 MG tablet Take 5 mg by mouth daily with breakfast.     Historical Provider, MD  tiotropium (SPIRIVA HANDIHALER) 18 MCG inhalation capsule Place 1 capsule (18 mcg total) into inhaler and inhale daily. 08/21/15   Hoyt Koch, MD  traMADol (ULTRAM-ER) 100 MG 24 hr tablet Take 1 tablet (100 mg total) by mouth daily. 06/27/15   Hoyt Koch, MD  triamcinolone cream (KENALOG) 0.1 % Apply 1 application topically 2 (two) times daily. 01/02/16   Hoyt Koch, MD    Family History Family History  Problem Relation Age of Onset  . Arthritis Mother   . Hypertension Mother   . Arthritis Father   . Cancer Father     colon  . Hypertension Father  Social History Social History  Substance Use Topics  . Smoking status: Former Smoker    Quit date: 05/01/2007  . Smokeless tobacco: Never Used  . Alcohol use No     Allergies   Codeine   Review of Systems Review of Systems  Constitutional: Negative for fever.  Respiratory: Negative for shortness of breath.   Cardiovascular: Negative for chest pain.  Gastrointestinal: Negative for nausea and vomiting.  Musculoskeletal: Positive for arthralgias ( chronic secondary to RA).  Skin: Positive for color change and wound.       Abscess of R breast     Physical Exam Updated Vital Signs BP 153/79 (BP Location: Left Arm)   Pulse (!) 59   Temp 98 F (36.7 C) (Oral)    Resp 16   Ht 5\' 1"  (1.549 m)   Wt 64.9 kg   SpO2 98%   BMI 27.02 kg/m   Physical Exam  Constitutional: She appears well-developed and well-nourished. No distress.  HENT:  Head: Normocephalic and atraumatic.  Eyes: Conjunctivae are normal. Right eye exhibits no discharge. Left eye exhibits no discharge. No scleral icterus.  Neck: Normal range of motion.  Cardiovascular: Normal rate and intact distal pulses.   Pulmonary/Chest: Effort normal. No respiratory distress.  Abdominal: She exhibits no distension.  Neurological: She is alert.  Skin: Skin is warm and dry. She is not diaphoretic.  Abscess to lower quadrant of R breast with mild surrounding cellulitis. Area is warm, tender to touch, and actively draining purulent discharge.  Psychiatric: She has a normal mood and affect. Her behavior is normal.  Nursing note and vitals reviewed.    ED Treatments / Results  DIAGNOSTIC STUDIES:  Oxygen Saturation is 99% on RA, normal by my interpretation.    COORDINATION OF CARE:  1:04 PM Discussed treatment plan with pt at bedside and pt agreed to plan.  Labs (all labs ordered are listed, but only abnormal results are displayed) Labs Reviewed - No data to display  EKG  EKG Interpretation None       Radiology No results found.  EMERGENCY DEPARTMENT US SOFT TISSUE INTERPRETATION "Study: Limited Ultrasound of the noted body part in comments below"  INDICATIONS: Pain and Soft tissue infection Multiple views of the body part are obtained with a multi-frequency linear probe  PERFORMED BY:  Myself  IMAGES ARCHIVED?: Yes  SIDE:Right   BODY PART:Breast  FINDINGS: Abcess present  LIMITATIONS:  Emergent Procedure  INTERPRETATION:  Abcess present  COMMENT:  .5cm x 1.5cm abscess in right breast, no doppler flow.   Procedures .Marland KitchenIncision and Drainage Date/Time: 01/31/2016 2:18 PM Performed by: Roxanna Mew Authorized by: Roxanna Mew   Consent:    Consent  obtained:  Verbal   Consent given by:  Patient   Risks discussed:  Incomplete drainage, infection and pain   Alternatives discussed:  No treatment Location:    Type:  Abscess   Size:  .5cmx1.5cm   Location:  Trunk   Trunk location:  R breast Pre-procedure details:    Skin preparation:  Betadine Sedation:    Sedation type: none. Anesthesia (see MAR for exact dosages):    Anesthesia method:  Local infiltration   Local anesthetic:  Lidocaine 1% w/o epi Procedure type:    Complexity:  Simple Procedure details:    Needle aspiration: no     Incision types:  Stab incision   Incision depth:  Dermal   Scalpel blade:  11   Wound management:  Irrigated with saline  Drainage:  Purulent and bloody   Drainage amount:  Moderate   Wound treatment:  Wound left open   Packing materials:  None Post-procedure details:    Patient tolerance of procedure:  Tolerated well, no immediate complications   (including critical care time)   Medications Ordered in ED Medications  lidocaine (XYLOCAINE) 1 % (with pres) injection 2 mL (2 mLs Infiltration Given 01/31/16 1400)     Initial Impression / Assessment and Plan / ED Course  I have reviewed the triage vital signs and the nursing notes.  Pertinent labs & imaging results that were available during my care of the patient were reviewed by me and considered in my medical decision making (see chart for details).  Clinical Course    Patient presents to ED abscess. Patient is afebrile and non-toxic appearing in NAD. VSS. Patient with skin abscess amenable to incision and drainage.  Abscess was not large enough to warrant packing or drain,  wound recheck in 2 days. Encouraged home warm soaks and flushing.  Mild signs of cellulitis is surrounding skin.  Will d/c to home with ABX therapy. Return precautions given. Patient voiced understanding and is agreeable.    Final Clinical Impressions(s) / ED Diagnoses   Final diagnoses:  Abscess and cellulitis     New Prescriptions New Prescriptions   CEPHALEXIN (KEFLEX) 500 MG CAPSULE    Take 1 capsule (500 mg total) by mouth 2 (two) times daily.  I personally performed the services described in this documentation, which was scribed in my presence. The recorded information has been reviewed and is accurate.     Roxanna Mew, [place] 01/31/16 1423    Dorie Rank, MD 02/02/16 1600

## 2016-01-31 NOTE — Discharge Instructions (Signed)
Read the information below.   You have an abscess. It was drained in the ED. You are being placed on antibiotics. The area will continue to drain. You can apply warm compresses and try expressing additional fluid. You can apply antibiotic ointment.  Use the prescribed medication as directed.  Please discuss all new medications with your pharmacist.   Be sure to follow up with your primary care doctor in 2-3 days for wound check.  You may return to the Emergency Department at any time for worsening condition or any new symptoms that concern you. Return to ED if you have increasing redness, develop a fever, vomiting, muscle aches.

## 2016-01-31 NOTE — ED Triage Notes (Signed)
PT HERE C/O AN ABSCESS UNDER THE RIGHT BREAST X2 YEARS. PT STS A WEEK AGO, THE AREA BECAME RED AND PAINFUL WITH DRAINAGE. DENIES FEVER OR CHILLS.

## 2016-02-04 ENCOUNTER — Emergency Department (HOSPITAL_COMMUNITY)
Admission: EM | Admit: 2016-02-04 | Discharge: 2016-02-04 | Discharge status: Absent without leave | Payer: Medicare Other

## 2016-02-05 DIAGNOSIS — M0570 Rheumatoid arthritis with rheumatoid factor of unspecified site without organ or systems involvement: Secondary | ICD-10-CM | POA: Diagnosis not present

## 2016-02-05 DIAGNOSIS — R700 Elevated erythrocyte sedimentation rate: Secondary | ICD-10-CM | POA: Diagnosis not present

## 2016-02-05 DIAGNOSIS — D649 Anemia, unspecified: Secondary | ICD-10-CM | POA: Diagnosis not present

## 2016-02-05 DIAGNOSIS — Z79899 Other long term (current) drug therapy: Secondary | ICD-10-CM | POA: Diagnosis not present

## 2016-02-17 DIAGNOSIS — R749 Abnormal serum enzyme level, unspecified: Secondary | ICD-10-CM | POA: Diagnosis not present

## 2016-02-17 DIAGNOSIS — M0570 Rheumatoid arthritis with rheumatoid factor of unspecified site without organ or systems involvement: Secondary | ICD-10-CM | POA: Diagnosis not present

## 2016-02-17 DIAGNOSIS — Z79899 Other long term (current) drug therapy: Secondary | ICD-10-CM | POA: Diagnosis not present

## 2016-03-19 DIAGNOSIS — M0570 Rheumatoid arthritis with rheumatoid factor of unspecified site without organ or systems involvement: Secondary | ICD-10-CM | POA: Diagnosis not present

## 2016-03-19 DIAGNOSIS — R749 Abnormal serum enzyme level, unspecified: Secondary | ICD-10-CM | POA: Diagnosis not present

## 2016-03-19 DIAGNOSIS — Z79899 Other long term (current) drug therapy: Secondary | ICD-10-CM | POA: Diagnosis not present

## 2016-03-26 ENCOUNTER — Encounter: Payer: Self-pay | Admitting: Emergency Medicine

## 2016-04-09 ENCOUNTER — Other Ambulatory Visit: Payer: Self-pay | Admitting: Internal Medicine

## 2016-04-10 ENCOUNTER — Telehealth: Payer: Self-pay | Admitting: Internal Medicine

## 2016-04-10 DIAGNOSIS — M069 Rheumatoid arthritis, unspecified: Secondary | ICD-10-CM

## 2016-04-10 NOTE — Telephone Encounter (Signed)
Referral placed, it would appear inhaler sent in today.

## 2016-04-10 NOTE — Telephone Encounter (Signed)
Daughter called in and said that pt new refills on her inhaler and also need a new referral to a rheumatologist.  Her Dr is dropping medicare and medicaid and can not longer see her  [facility]

## 2016-04-14 DIAGNOSIS — M0570 Rheumatoid arthritis with rheumatoid factor of unspecified site without organ or systems involvement: Secondary | ICD-10-CM | POA: Diagnosis not present

## 2016-04-14 DIAGNOSIS — Z79899 Other long term (current) drug therapy: Secondary | ICD-10-CM | POA: Diagnosis not present

## 2016-04-24 DIAGNOSIS — H25013 Cortical age-related cataract, bilateral: Secondary | ICD-10-CM | POA: Diagnosis not present

## 2016-04-24 DIAGNOSIS — H43393 Other vitreous opacities, bilateral: Secondary | ICD-10-CM | POA: Diagnosis not present

## 2016-04-24 DIAGNOSIS — H2513 Age-related nuclear cataract, bilateral: Secondary | ICD-10-CM | POA: Diagnosis not present

## 2016-04-24 DIAGNOSIS — H524 Presbyopia: Secondary | ICD-10-CM | POA: Diagnosis not present

## 2016-04-27 ENCOUNTER — Telehealth: Payer: Self-pay | Admitting: Internal Medicine

## 2016-04-27 DIAGNOSIS — M0579 Rheumatoid arthritis with rheumatoid factor of multiple sites without organ or systems involvement: Secondary | ICD-10-CM

## 2016-04-27 NOTE — Telephone Encounter (Signed)
Referral placed.

## 2016-04-27 NOTE — Telephone Encounter (Signed)
Is requesting Dr. Sharlet Salina to refer patient to Dr. Bo Merino for rheumatoid arthritis.  States current doctor md will no longer be taking patients insurance at first of year.

## 2016-05-05 ENCOUNTER — Telehealth: Payer: Self-pay | Admitting: Rheumatology

## 2016-05-05 DIAGNOSIS — H2513 Age-related nuclear cataract, bilateral: Secondary | ICD-10-CM | POA: Diagnosis not present

## 2016-05-05 NOTE — Telephone Encounter (Signed)
Patient's daughter called stating the patient is leaving her current rheumatologist because he is no longer accepting her [facility] after December 31. Dr. Charlestine Night is faxing her records to [place].

## 2016-05-06 NOTE — Telephone Encounter (Signed)
Inez Catalina to advise when records are received.

## 2016-05-07 DIAGNOSIS — M25472 Effusion, left ankle: Secondary | ICD-10-CM | POA: Diagnosis not present

## 2016-05-07 DIAGNOSIS — R7989 Other specified abnormal findings of blood chemistry: Secondary | ICD-10-CM | POA: Diagnosis not present

## 2016-05-07 DIAGNOSIS — M25562 Pain in left knee: Secondary | ICD-10-CM | POA: Diagnosis not present

## 2016-05-07 DIAGNOSIS — M0570 Rheumatoid arthritis with rheumatoid factor of unspecified site without organ or systems involvement: Secondary | ICD-10-CM | POA: Diagnosis not present

## 2016-05-07 DIAGNOSIS — M25552 Pain in left hip: Secondary | ICD-10-CM | POA: Diagnosis not present

## 2016-05-27 DIAGNOSIS — M0570 Rheumatoid arthritis with rheumatoid factor of unspecified site without organ or systems involvement: Secondary | ICD-10-CM | POA: Diagnosis not present

## 2016-05-27 DIAGNOSIS — Z79899 Other long term (current) drug therapy: Secondary | ICD-10-CM | POA: Diagnosis not present

## 2016-06-05 DIAGNOSIS — H25043 Posterior subcapsular polar age-related cataract, bilateral: Secondary | ICD-10-CM | POA: Diagnosis not present

## 2016-06-09 ENCOUNTER — Encounter: Payer: Self-pay | Admitting: *Deleted

## 2016-06-12 NOTE — Discharge Instructions (Signed)
Cataract Surgery, Care After °Refer to this sheet in the next few weeks. These instructions provide you with information about caring for yourself after your procedure. Your health care provider may also give you more specific instructions. Your treatment has been planned according to current medical practices, but problems sometimes occur. Call your health care provider if you have any problems or questions after your procedure. °What can I expect after the procedure? °After the procedure, it is common to have: °· Itching. °· Discomfort. °· Fluid discharge. °· Sensitivity to light and to touch. °· Bruising. °Follow these instructions at home: °Eye Care  °· Check your eye every day for signs of infection. Watch for: °¨ Redness, swelling, or pain. °¨ Fluid, blood, or pus. °¨ Warmth. °¨ Bad smell. °Activity  °· Avoid strenuous activities, such as playing contact sports, for as long as told by your health care provider. °· Do not drive or operate heavy machinery until your health care provider approves. °· Do not bend or lift heavy objects . Bending increases pressure in the eye. You can walk, climb stairs, and do light household chores. °· Ask your health care provider when you can return to work. If you work in a dusty environment, you may be advised to wear protective eyewear for a period of time. °General instructions  °· Take or apply over-the-counter and prescription medicines only as told by your health care provider. This includes eye drops. °· Do not touch or rub your eyes. °· If you were given a protective shield, wear it as told by your health care provider. If you were not given a protective shield, wear sunglasses as told by your health care provider to protect your eyes. °· Keep the area around your eye clean and dry. Avoid swimming or allowing water to hit you directly in the face while showering until told by your health care provider. Keep soap and shampoo out of your eyes. °· Do not put a contact lens  into the affected eye or eyes until your health care provider approves. °· Keep all follow-up visits as told by your health care provider. This is important. °Contact a health care provider if: ° °· You have increased bruising around your eye. °· You have pain that is not helped with medicine. °· You have a fever. °· You have redness, swelling, or pain in your eye. °· You have fluid, blood, or pus coming from your incision. °· Your vision gets worse. °Get help right away if: °· You have sudden vision loss. °This information is not intended to replace advice given to you by your health care provider. Make sure you discuss any questions you have with your health care provider. °Document Released: 11/28/2004 Document Revised: 09/19/2015 Document Reviewed: 03/21/2015 °Elsevier Interactive Patient Education © 2017 Elsevier Inc[employer] ° ° ° ° °General Anesthesia, Adult, Care After °These instructions provide you with information about caring for yourself after your procedure. Your health care provider may also give you more specific instructions. Your treatment has been planned according to current medical practices, but problems sometimes occur. Call your health care provider if you have any problems or questions after your procedure. °What can I expect after the procedure? °After the procedure, it is common to have: °· Vomiting. °· A sore throat. °· Mental slowness. °It is common to feel: °· Nauseous. °· Cold or shivery. °· Sleepy. °· Tired. °· Sore or achy, even in parts of your body where you did not have surgery. °Follow these instructions at   home: °For at least 24 hours after the procedure:  °· Do not: °¨ Participate in activities where you could fall or become injured. °¨ Drive. °¨ Use heavy machinery. °¨ Drink alcohol. °¨ Take sleeping pills or medicines that cause drowsiness. °¨ Make important decisions or sign legal documents. °¨ Take care of children on your own. °· Rest. °Eating and drinking  °· If you vomit, drink  water, juice, or soup when you can drink without vomiting. °· Drink enough fluid to keep your urine clear or pale yellow. °· Make sure you have little or no nausea before eating solid foods. °· Follow the diet recommended by your health care provider. °General instructions  °· Have a responsible adult stay with you until you are awake and alert. °· Return to your normal activities as told by your health care provider. Ask your health care provider what activities are safe for you. °· Take over-the-counter and prescription medicines only as told by your health care provider. °· If you smoke, do not smoke without supervision. °· Keep all follow-up visits as told by your health care provider. This is important. °Contact a health care provider if: °· You continue to have nausea or vomiting at home, and medicines are not helpful. °· You cannot drink fluids or start eating again. °· You cannot urinate after 8-12 hours. °· You develop a skin rash. °· You have fever. °· You have increasing redness at the site of your procedure. °Get help right away if: °· You have difficulty breathing. °· You have chest pain. °· You have unexpected bleeding. °· You feel that you are having a life-threatening or urgent problem. °This information is not intended to replace advice given to you by your health care provider. Make sure you discuss any questions you have with your health care provider. °Document Released: 08/17/2000 Document Revised: 10/14/2015 Document Reviewed: 04/25/2015 °Elsevier Interactive Patient Education © 2017 Elsevier Inc. ° °

## 2016-06-16 ENCOUNTER — Ambulatory Visit: Payer: Medicare Other | Admitting: Anesthesiology

## 2016-06-16 ENCOUNTER — Ambulatory Visit
Admission: RE | Admit: 2016-06-16 | Discharge: 2016-06-16 | Disposition: A | Discharge status: Home / Self Care | Payer: Medicare Other | Source: Ambulatory Visit | Attending: Ophthalmology | Admitting: Ophthalmology

## 2016-06-16 ENCOUNTER — Encounter
Admission: RE | Disposition: A | Discharge status: Home / Self Care | Payer: Self-pay | Source: Ambulatory Visit | Attending: Ophthalmology

## 2016-06-16 ENCOUNTER — Telehealth: Payer: Self-pay | Admitting: Internal Medicine

## 2016-06-16 DIAGNOSIS — Z87891 Personal history of nicotine dependence: Secondary | ICD-10-CM | POA: Insufficient documentation

## 2016-06-16 DIAGNOSIS — Z7952 Long term (current) use of systemic steroids: Secondary | ICD-10-CM | POA: Diagnosis not present

## 2016-06-16 DIAGNOSIS — H25042 Posterior subcapsular polar age-related cataract, left eye: Secondary | ICD-10-CM | POA: Diagnosis not present

## 2016-06-16 DIAGNOSIS — H25043 Posterior subcapsular polar age-related cataract, bilateral: Secondary | ICD-10-CM | POA: Diagnosis not present

## 2016-06-16 DIAGNOSIS — Z79899 Other long term (current) drug therapy: Secondary | ICD-10-CM | POA: Diagnosis not present

## 2016-06-16 DIAGNOSIS — H2512 Age-related nuclear cataract, left eye: Secondary | ICD-10-CM | POA: Insufficient documentation

## 2016-06-16 DIAGNOSIS — J449 Chronic obstructive pulmonary disease, unspecified: Secondary | ICD-10-CM | POA: Insufficient documentation

## 2016-06-16 HISTORY — PX: CATARACT EXTRACTION W/PHACO: SHX586

## 2016-06-16 HISTORY — DX: Presence of dental prosthetic device (complete) (partial): Z97.2

## 2016-06-16 SURGERY — PHACOEMULSIFICATION, CATARACT, WITH IOL INSERTION
Anesthesia: Monitor Anesthesia Care | Laterality: Left | Wound class: Clean

## 2016-06-16 MED ORDER — MIDAZOLAM HCL 2 MG/2ML IJ SOLN
INTRAMUSCULAR | Status: DC | PRN
  Administered 2016-06-16: 1.5 mg via INTRAVENOUS

## 2016-06-16 MED ORDER — MOXIFLOXACIN HCL 0.5 % OP SOLN
OPHTHALMIC | Status: DC | PRN
  Administered 2016-06-16: 1 [drp] via OPHTHALMIC

## 2016-06-16 MED ORDER — EPINEPHRINE PF 1 MG/ML IJ SOLN
INTRAOCULAR | Status: DC | PRN
  Administered 2016-06-16: 74 mL via OPHTHALMIC

## 2016-06-16 MED ORDER — LIDOCAINE HCL (PF) 4 % IJ SOLN
INTRAOCULAR | Status: DC | PRN
  Administered 2016-06-16: .5 mL via OPHTHALMIC

## 2016-06-16 MED ORDER — ARMC OPHTHALMIC DILATING DROPS
1.0000 "application " | OPHTHALMIC | Status: DC | PRN
  Administered 2016-06-16 (×3): 1 via OPHTHALMIC

## 2016-06-16 MED ORDER — SODIUM HYALURONATE 23 MG/ML IO SOLN
INTRAOCULAR | Status: DC | PRN
  Administered 2016-06-16: 0.6 mL via INTRAOCULAR

## 2016-06-16 MED ORDER — LACTATED RINGERS IV SOLN: INTRAVENOUS | Status: DC

## 2016-06-16 MED ORDER — SODIUM HYALURONATE 10 MG/ML IO SOLN
INTRAOCULAR | Status: DC | PRN
  Administered 2016-06-16: .55 mL via INTRAOCULAR

## 2016-06-16 MED ORDER — FENTANYL CITRATE (PF) 100 MCG/2ML IJ SOLN
INTRAMUSCULAR | Status: DC | PRN
  Administered 2016-06-16: 50 ug via INTRAVENOUS

## 2016-06-16 SURGICAL SUPPLY — 20 items
CANNULA ANT/CHMB 27G (MISCELLANEOUS) ×1 IMPLANT
CANNULA ANT/CHMB 27GA (MISCELLANEOUS) ×3 IMPLANT
CUP MEDICINE 2OZ PLAST GRAD ST (MISCELLANEOUS) ×3 IMPLANT
DISSECTOR HYDRO NUCLEUS 50X22 (MISCELLANEOUS) ×3 IMPLANT
GLOVE BIO SURGEON STRL SZ8 (GLOVE) ×3 IMPLANT
GLOVE SURG LX 7.5 STRW (GLOVE) ×2
GLOVE SURG LX STRL 7.5 STRW (GLOVE) ×1 IMPLANT
GOWN STRL REUS W/ TWL LRG LVL3 (GOWN DISPOSABLE) ×2 IMPLANT
GOWN STRL REUS W/TWL LRG LVL3 (GOWN DISPOSABLE) ×6
LENS IOL TECNIS ITEC 24.5 (Intraocular Lens) ×2 IMPLANT
MARKER SKIN DUAL TIP RULER LAB (MISCELLANEOUS) ×3 IMPLANT
PACK CATARACT (MISCELLANEOUS) ×3 IMPLANT
PACK CATARACT BRASINGTON (MISCELLANEOUS) ×3 IMPLANT
PACK EYE AFTER SURG (MISCELLANEOUS) ×3 IMPLANT
SOL PREP PVP 2OZ (MISCELLANEOUS) ×3
SOLUTION PREP PVP 2OZ (MISCELLANEOUS) ×1 IMPLANT
SYR 3ML LL SCALE MARK (SYRINGE) ×3 IMPLANT
SYR TB 1ML LUER SLIP (SYRINGE) ×3 IMPLANT
WATER STERILE IRR 250ML POUR (IV SOLUTION) ×3 IMPLANT
WIPE NON LINTING 3.25X3.25 (MISCELLANEOUS) ×3 IMPLANT

## 2016-06-16 NOTE — Telephone Encounter (Signed)
Would need visit as tramadol is a controlled substance.

## 2016-06-16 NOTE — Telephone Encounter (Signed)
Scheduled patient for visit

## 2016-06-16 NOTE — Telephone Encounter (Signed)
Pt's daughter called explaining that the pt's rheumatologist is dropping her from the practice because of insurance and her tramadol RX has expired. They are trying to find a new rheumatologist. Pt's ins also changed to [facility] on [address]. Pt needs a new tramadol RX, please advise

## 2016-06-16 NOTE — Anesthesia Preprocedure Evaluation (Signed)
Anesthesia Evaluation  Patient identified by MRN, date of birth, ID band Patient awake    Reviewed: Allergy & Precautions, H&P , NPO status , Patient's Chart, lab work & pertinent test results, reviewed documented beta blocker date and time   Airway Mallampati: II  TM Distance: >3 FB Neck ROM: full    Dental  (+) Edentulous Lower, Edentulous Upper   Pulmonary asthma , COPD, former smoker,    Pulmonary exam normal breath sounds clear to auscultation       Cardiovascular Exercise Tolerance: Good +CHF  Normal cardiovascular exam Rhythm:regular Rate:Normal     Neuro/Psych negative neurological ROS  negative psych ROS   GI/Hepatic negative GI ROS, Neg liver ROS,   Endo/Other  negative endocrine ROS  Renal/GU negative Renal ROS  negative genitourinary   Musculoskeletal  (+) Arthritis , Rheumatoid disorders,    Abdominal   Peds  Hematology negative hematology ROS (+)   Anesthesia Other Findings   Reproductive/Obstetrics negative OB ROS                             Anesthesia Physical Anesthesia Plan  ASA: III  Anesthesia Plan: MAC   Post-op Pain Management:    Induction:   Airway Management Planned: Nasal Cannula  Additional Equipment:   Intra-op Plan:   Post-operative Plan:   Informed Consent: I have reviewed the patients History and Physical, chart, labs and discussed the procedure including the risks, benefits and alternatives for the proposed anesthesia with the patient or authorized representative who has indicated his/her understanding and acceptance.   Dental Advisory Given  Plan Discussed with: CRNA  Anesthesia Plan Comments:         Anesthesia Quick Evaluation

## 2016-06-16 NOTE — Anesthesia Procedure Notes (Signed)
Procedure Name: MAC Performed by: Mayme Genta Pre-anesthesia Checklist: Patient identified, Emergency Drugs available, Suction available, Timeout performed and Patient being monitored Patient Re-evaluated:Patient Re-evaluated prior to inductionOxygen Delivery Method: Nasal cannula Placement Confirmation: positive ETCO2

## 2016-06-16 NOTE — H&P (Signed)
The History and Physical notes are on paper, have been signed, and are to be scanned. The patient remains stable and unchanged from the H&P.   Previous H&P reviewed, patient examined, and there are no changes.  Sandra Schwartz 06/16/2016 8:43 AM

## 2016-06-16 NOTE — Anesthesia Postprocedure Evaluation (Signed)
Anesthesia Post Note  Patient: Sandra Schwartz  Procedure(s) Performed: Procedure(s) (LRB): CATARACT EXTRACTION PHACO AND INTRAOCULAR LENS PLACEMENT (IOC)  Left (Left)  Patient location during evaluation: PACU Anesthesia Type: MAC Level of consciousness: awake and alert Pain management: pain level controlled Vital Signs Assessment: post-procedure vital signs reviewed and stable Respiratory status: spontaneous breathing, nonlabored ventilation and respiratory function stable Cardiovascular status: stable and blood pressure returned to baseline Anesthetic complications: no    Trecia Rogers

## 2016-06-16 NOTE — Transfer of Care (Signed)
Immediate Anesthesia Transfer of Care Note  Patient: Sandra Schwartz  Procedure(s) Performed: Procedure(s) with comments: CATARACT EXTRACTION PHACO AND INTRAOCULAR LENS PLACEMENT (IOC)  Left (Left) - Left eye IVA Topical  Patient Location: PACU  Anesthesia Type: MAC  Level of Consciousness: awake, alert  and patient cooperative  Airway and Oxygen Therapy: Patient Spontanous Breathing and Patient connected to supplemental oxygen  Post-op Assessment: Post-op Vital signs reviewed, Patient's Cardiovascular Status Stable, Respiratory Function Stable, Patent Airway and No signs of Nausea or vomiting  Post-op Vital Signs: Reviewed and stable  Complications: No apparent anesthesia complications

## 2016-06-16 NOTE — Op Note (Signed)
OPERATIVE NOTE  Sandra Schwartz [id number] 06/16/2016   PREOPERATIVE DIAGNOSIS:  Nuclear sclerotic cataract left eye.  H25.12   POSTOPERATIVE DIAGNOSIS:    Nuclear sclerotic cataract left eye.     PROCEDURE:  Phacoemusification with posterior chamber intraocular lens placement of the left eye   LENS:   Implant Name Type Inv. Item Serial No. Manufacturer Lot No. LRB No. Used  LENS IOL DIOP 24.5 - [id number] Intraocular Lens LENS IOL DIOP 24.5 [id number]   Left 1       PCB00 +24.5   ULTRASOUND TIME: 0 minutes 45 seconds.  CDE 6.58   SURGEON:  Benay Pillow, MD, MPH   ANESTHESIA:  Topical with tetracaine drops augmented with 1% preservative-free intracameral lidocaine.  ESTIMATED BLOOD LOSS: <1 mL   COMPLICATIONS:  None.   DESCRIPTION OF PROCEDURE:  The patient was identified in the holding room and transported to the operating room and placed in the supine position under the operating microscope.  The left eye was identified as the operative eye and it was prepped and draped in the usual sterile ophthalmic fashion.   A 1.0 millimeter clear-corneal paracentesis was made at the 5:00 position. 0.5 ml of preservative-free 1% lidocaine with epinephrine was injected into the anterior chamber.  The anterior chamber was filled with Healon 5 viscoelastic.  A 2.4 millimeter keratome was used to make a near-clear corneal incision at the 2:00 position.  A curvilinear capsulorrhexis was made with a cystotome and capsulorrhexis forceps.  Balanced salt solution was used to hydrodissect and hydrodelineate the nucleus.   Phacoemulsification was then used in stop and chop fashion to remove the lens nucleus and epinucleus.  The remaining cortex was then removed using the irrigation and aspiration handpiece. Healon was then placed into the capsular bag to distend it for lens placement.  A lens was then injected into the capsular bag.  The remaining viscoelastic was aspirated.   Wounds were hydrated with  balanced salt solution.  The anterior chamber was inflated to a physiologic pressure with balanced salt solution.   Intracameral vigamox 0.1 mL undiltued was injected into the eye and a drop placed onto the ocular surface.  No wound leaks were noted.  The patient was taken to the recovery room in stable condition without complications of anesthesia or surgery  Benay Pillow 06/16/2016, 9:23 AM

## 2016-06-17 ENCOUNTER — Encounter: Payer: Self-pay | Admitting: Ophthalmology

## 2016-06-19 ENCOUNTER — Encounter: Payer: Self-pay | Admitting: Internal Medicine

## 2016-06-19 ENCOUNTER — Ambulatory Visit (INDEPENDENT_AMBULATORY_CARE_PROVIDER_SITE_OTHER): Payer: Medicare Other | Admitting: Internal Medicine

## 2016-06-19 DIAGNOSIS — M069 Rheumatoid arthritis, unspecified: Secondary | ICD-10-CM

## 2016-06-19 MED ORDER — TRAMADOL HCL ER 100 MG PO TB24: 100.0000 mg | ORAL_TABLET | Freq: Every day | ORAL | 2 refills | Status: DC

## 2016-06-19 MED ORDER — PANTOPRAZOLE SODIUM 40 MG PO TBEC: 40.0000 mg | DELAYED_RELEASE_TABLET | Freq: Every day | ORAL | 3 refills | Status: DC

## 2016-06-19 NOTE — Progress Notes (Signed)
   Subjective:    Patient ID: Sandra Schwartz, female    DOB: 02/04/1946, 71 y.o.   MRN: [id number]  HPI The patient is a 71 YO female coming in for her rheumatoid arthritis. She is taking chronic prednisone. She is not supposed to take NSAIDs while on prednisone and so uses tramadol ER for pain when needed. Her rheumatologist is retiring and she needs referral for a new one closer to her home. She was declined by another rheumatologist in town. No flare right now. ust changed methotrexate doses and still on prednisone.   Review of Systems  Constitutional: Negative for activity change, appetite change, chills, fatigue, fever and unexpected weight change.  Respiratory: Negative.   Cardiovascular: Negative.   Gastrointestinal: Negative.   Musculoskeletal: Positive for arthralgias. Negative for gait problem, joint swelling, myalgias, neck pain and neck stiffness.  Skin: Negative.       Objective:   Physical Exam  Constitutional: She appears well-developed and well-nourished.  HENT:  Head: Normocephalic and atraumatic.  Eyes: EOM are normal.  Neck: Normal range of motion.  Cardiovascular: Normal rate and regular rhythm.   Pulmonary/Chest: Effort normal. No respiratory distress. She has no wheezes. She has no rales.  Abdominal: Soft.  Musculoskeletal:  Chronic changes consistent with RA, no synovitis.   Skin: Skin is warm and dry.   Vitals:   06/19/16 1122  BP: 132/78  Pulse: 75  Resp: 16  Temp: 97.8 F (36.6 C)  TempSrc: Oral  SpO2: 98%  Weight: 151 lb (68.5 kg)  Height: 5\' 1"  (1.549 m)      Assessment & Plan:

## 2016-06-19 NOTE — Progress Notes (Signed)
Pre visit review using our clinic review tool, if applicable. No additional management support is needed unless otherwise documented below in the visit note. 

## 2016-06-19 NOTE — Assessment & Plan Note (Signed)
Referral to rheumatology and refill tramadol ER for pain.

## 2016-06-19 NOTE — Patient Instructions (Signed)
We have sent in protonix for the acid reflux. Take 1 pill daily with or before breakfast.   We have gotten the rheumatology referral to Dr. Meda Coffee.  We have refilled the tramadol today.

## 2016-06-24 DIAGNOSIS — H2511 Age-related nuclear cataract, right eye: Secondary | ICD-10-CM | POA: Diagnosis not present

## 2016-07-02 ENCOUNTER — Encounter: Payer: Self-pay | Admitting: *Deleted

## 2016-07-02 DIAGNOSIS — Z79899 Other long term (current) drug therapy: Secondary | ICD-10-CM | POA: Diagnosis not present

## 2016-07-02 DIAGNOSIS — M059 Rheumatoid arthritis with rheumatoid factor, unspecified: Secondary | ICD-10-CM | POA: Diagnosis not present

## 2016-07-02 DIAGNOSIS — Z7952 Long term (current) use of systemic steroids: Secondary | ICD-10-CM | POA: Diagnosis not present

## 2016-07-02 DIAGNOSIS — M810 Age-related osteoporosis without current pathological fracture: Secondary | ICD-10-CM | POA: Diagnosis not present

## 2016-07-03 NOTE — Discharge Instructions (Signed)
Cataract Surgery, Care After °Refer to this sheet in the next few weeks. These instructions provide you with information about caring for yourself after your procedure. Your health care provider may also give you more specific instructions. Your treatment has been planned according to current medical practices, but problems sometimes occur. Call your health care provider if you have any problems or questions after your procedure. °What can I expect after the procedure? °After the procedure, it is common to have: °· Itching. °· Discomfort. °· Fluid discharge. °· Sensitivity to light and to touch. °· Bruising. °Follow these instructions at home: °Eye Care  °· Check your eye every day for signs of infection. Watch for: °¨ Redness, swelling, or pain. °¨ Fluid, blood, or pus. °¨ Warmth. °¨ Bad smell. °Activity  °· Avoid strenuous activities, such as playing contact sports, for as long as told by your health care provider. °· Do not drive or operate heavy machinery until your health care provider approves. °· Do not bend or lift heavy objects . Bending increases pressure in the eye. You can walk, climb stairs, and do light household chores. °· Ask your health care provider when you can return to work. If you work in a dusty environment, you may be advised to wear protective eyewear for a period of time. °General instructions  °· Take or apply over-the-counter and prescription medicines only as told by your health care provider. This includes eye drops. °· Do not touch or rub your eyes. °· If you were given a protective shield, wear it as told by your health care provider. If you were not given a protective shield, wear sunglasses as told by your health care provider to protect your eyes. °· Keep the area around your eye clean and dry. Avoid swimming or allowing water to hit you directly in the face while showering until told by your health care provider. Keep soap and shampoo out of your eyes. °· Do not put a contact lens  into the affected eye or eyes until your health care provider approves. °· Keep all follow-up visits as told by your health care provider. This is important. °Contact a health care provider if: ° °· You have increased bruising around your eye. °· You have pain that is not helped with medicine. °· You have a fever. °· You have redness, swelling, or pain in your eye. °· You have fluid, blood, or pus coming from your incision. °· Your vision gets worse. °Get help right away if: °· You have sudden vision loss. °This information is not intended to replace advice given to you by your health care provider. Make sure you discuss any questions you have with your health care provider. °Document Released: 11/28/2004 Document Revised: 09/19/2015 Document Reviewed: 03/21/2015 °Elsevier Interactive Patient Education © 2017 Elsevier Inc[employer] ° ° ° ° °General Anesthesia, Adult, Care After °These instructions provide you with information about caring for yourself after your procedure. Your health care provider may also give you more specific instructions. Your treatment has been planned according to current medical practices, but problems sometimes occur. Call your health care provider if you have any problems or questions after your procedure. °What can I expect after the procedure? °After the procedure, it is common to have: °· Vomiting. °· A sore throat. °· Mental slowness. °It is common to feel: °· Nauseous. °· Cold or shivery. °· Sleepy. °· Tired. °· Sore or achy, even in parts of your body where you did not have surgery. °Follow these instructions at   home: °For at least 24 hours after the procedure:  °· Do not: °¨ Participate in activities where you could fall or become injured. °¨ Drive. °¨ Use heavy machinery. °¨ Drink alcohol. °¨ Take sleeping pills or medicines that cause drowsiness. °¨ Make important decisions or sign legal documents. °¨ Take care of children on your own. °· Rest. °Eating and drinking  °· If you vomit, drink  water, juice, or soup when you can drink without vomiting. °· Drink enough fluid to keep your urine clear or pale yellow. °· Make sure you have little or no nausea before eating solid foods. °· Follow the diet recommended by your health care provider. °General instructions  °· Have a responsible adult stay with you until you are awake and alert. °· Return to your normal activities as told by your health care provider. Ask your health care provider what activities are safe for you. °· Take over-the-counter and prescription medicines only as told by your health care provider. °· If you smoke, do not smoke without supervision. °· Keep all follow-up visits as told by your health care provider. This is important. °Contact a health care provider if: °· You continue to have nausea or vomiting at home, and medicines are not helpful. °· You cannot drink fluids or start eating again. °· You cannot urinate after 8-12 hours. °· You develop a skin rash. °· You have fever. °· You have increasing redness at the site of your procedure. °Get help right away if: °· You have difficulty breathing. °· You have chest pain. °· You have unexpected bleeding. °· You feel that you are having a life-threatening or urgent problem. °This information is not intended to replace advice given to you by your health care provider. Make sure you discuss any questions you have with your health care provider. °Document Released: 08/17/2000 Document Revised: 10/14/2015 Document Reviewed: 04/25/2015 °Elsevier Interactive Patient Education © 2017 Elsevier Inc. ° °

## 2016-07-06 MED ORDER — ACETAMINOPHEN 325 MG PO TABS: 325.0000 mg | ORAL_TABLET | ORAL | Status: DC | PRN

## 2016-07-06 MED ORDER — ACETAMINOPHEN 160 MG/5ML PO SOLN: 325.0000 mg | ORAL | Status: DC | PRN

## 2016-07-07 ENCOUNTER — Ambulatory Visit: Payer: Medicare Other | Admitting: Anesthesiology

## 2016-07-07 ENCOUNTER — Encounter
Admission: RE | Disposition: A | Discharge status: Home / Self Care | Payer: Self-pay | Source: Ambulatory Visit | Attending: Ophthalmology

## 2016-07-07 ENCOUNTER — Ambulatory Visit
Admission: RE | Admit: 2016-07-07 | Discharge: 2016-07-07 | Disposition: A | Discharge status: Home / Self Care | Payer: Medicare Other | Source: Ambulatory Visit | Attending: Ophthalmology | Admitting: Ophthalmology

## 2016-07-07 DIAGNOSIS — Z87891 Personal history of nicotine dependence: Secondary | ICD-10-CM | POA: Diagnosis not present

## 2016-07-07 DIAGNOSIS — H2511 Age-related nuclear cataract, right eye: Secondary | ICD-10-CM | POA: Insufficient documentation

## 2016-07-07 DIAGNOSIS — J449 Chronic obstructive pulmonary disease, unspecified: Secondary | ICD-10-CM | POA: Insufficient documentation

## 2016-07-07 DIAGNOSIS — M1990 Unspecified osteoarthritis, unspecified site: Secondary | ICD-10-CM | POA: Diagnosis not present

## 2016-07-07 DIAGNOSIS — I110 Hypertensive heart disease with heart failure: Secondary | ICD-10-CM | POA: Insufficient documentation

## 2016-07-07 DIAGNOSIS — G709 Myoneural disorder, unspecified: Secondary | ICD-10-CM | POA: Diagnosis not present

## 2016-07-07 DIAGNOSIS — I509 Heart failure, unspecified: Secondary | ICD-10-CM | POA: Diagnosis not present

## 2016-07-07 HISTORY — PX: CATARACT EXTRACTION W/PHACO: SHX586

## 2016-07-07 SURGERY — PHACOEMULSIFICATION, CATARACT, WITH IOL INSERTION
Anesthesia: Monitor Anesthesia Care | Laterality: Right | Wound class: Clean

## 2016-07-07 MED ORDER — SODIUM HYALURONATE 23 MG/ML IO SOLN
INTRAOCULAR | Status: DC | PRN
  Administered 2016-07-07: 0.6 mL via INTRAOCULAR

## 2016-07-07 MED ORDER — LIDOCAINE HCL (PF) 2 % IJ SOLN
INTRAOCULAR | Status: DC | PRN
  Administered 2016-07-07: .5 mL via INTRAOCULAR

## 2016-07-07 MED ORDER — ARMC OPHTHALMIC DILATING DROPS
1.0000 "application " | OPHTHALMIC | Status: DC | PRN
  Administered 2016-07-07 (×3): 1 via OPHTHALMIC

## 2016-07-07 MED ORDER — SODIUM HYALURONATE 10 MG/ML IO SOLN
INTRAOCULAR | Status: DC | PRN
  Administered 2016-07-07: 0.55 mL via INTRAOCULAR

## 2016-07-07 MED ORDER — MOXIFLOXACIN HCL 0.5 % OP SOLN
OPHTHALMIC | Status: DC | PRN
  Administered 2016-07-07: 0.2 mL via OPHTHALMIC

## 2016-07-07 MED ORDER — MIDAZOLAM HCL 2 MG/2ML IJ SOLN
INTRAMUSCULAR | Status: DC | PRN
  Administered 2016-07-07: 2 mg via INTRAVENOUS

## 2016-07-07 MED ORDER — EPINEPHRINE PF 1 MG/ML IJ SOLN
INTRAOCULAR | Status: DC | PRN
  Administered 2016-07-07: 98 mL via OPHTHALMIC

## 2016-07-07 MED ORDER — FENTANYL CITRATE (PF) 100 MCG/2ML IJ SOLN
INTRAMUSCULAR | Status: DC | PRN
  Administered 2016-07-07: 50 ug via INTRAVENOUS

## 2016-07-07 SURGICAL SUPPLY — 18 items
CANNULA ANT/CHMB 27G (MISCELLANEOUS) ×1 IMPLANT
CANNULA ANT/CHMB 27GA (MISCELLANEOUS) ×3 IMPLANT
CUP MEDICINE 2OZ PLAST GRAD ST (MISCELLANEOUS) ×3 IMPLANT
DISSECTOR HYDRO NUCLEUS 50X22 (MISCELLANEOUS) ×3 IMPLANT
GLOVE BIO SURGEON STRL SZ8 (GLOVE) ×3 IMPLANT
GLOVE SURG LX 7.5 STRW (GLOVE) ×2
GLOVE SURG LX STRL 7.5 STRW (GLOVE) ×1 IMPLANT
GOWN STRL REUS W/ TWL LRG LVL3 (GOWN DISPOSABLE) ×2 IMPLANT
GOWN STRL REUS W/TWL LRG LVL3 (GOWN DISPOSABLE) ×6
LENS IOL TECNIS ITEC 25.5 (Intraocular Lens) ×2 IMPLANT
MARKER SKIN DUAL TIP RULER LAB (MISCELLANEOUS) ×3 IMPLANT
PACK CATARACT (MISCELLANEOUS) ×3 IMPLANT
PACK CATARACT BRASINGTON (MISCELLANEOUS) ×3 IMPLANT
PACK EYE AFTER SURG (MISCELLANEOUS) ×3 IMPLANT
SYR 3ML LL SCALE MARK (SYRINGE) ×3 IMPLANT
SYR TB 1ML LUER SLIP (SYRINGE) ×3 IMPLANT
WATER STERILE IRR 250ML POUR (IV SOLUTION) ×3 IMPLANT
WIPE NON LINTING 3.25X3.25 (MISCELLANEOUS) ×3 IMPLANT

## 2016-07-07 NOTE — Anesthesia Preprocedure Evaluation (Addendum)
Anesthesia Evaluation  Patient identified by MRN, date of birth, ID band Patient awake    Reviewed: Allergy & Precautions, NPO status , Patient's Chart, lab work & pertinent test results  Airway Mallampati: I  TM Distance: >3 FB Neck ROM: Full    Dental  (+) Upper Dentures, Lower Dentures   Pulmonary asthma , COPD, former smoker,    Pulmonary exam normal        Cardiovascular hypertension, +CHF  Normal cardiovascular exam     Neuro/Psych PSYCHIATRIC DISORDERS  Neuromuscular disease    GI/Hepatic negative GI ROS, Neg liver ROS,   Endo/Other  negative endocrine ROS  Renal/GU negative Renal ROS     Musculoskeletal  (+) Arthritis , Rheumatoid disorders,    Abdominal   Peds  Hematology negative hematology ROS (+)   Anesthesia Other Findings   Reproductive/Obstetrics                            Anesthesia Physical Anesthesia Plan  ASA: III  Anesthesia Plan: MAC   Post-op Pain Management:    Induction: Intravenous  Airway Management Planned:   Additional Equipment:   Intra-op Plan:   Post-operative Plan:   Informed Consent: I have reviewed the patients History and Physical, chart, labs and discussed the procedure including the risks, benefits and alternatives for the proposed anesthesia with the patient or authorized representative who has indicated his/her understanding and acceptance.     Plan Discussed with: CRNA  Anesthesia Plan Comments:         Anesthesia Quick Evaluation

## 2016-07-07 NOTE — Anesthesia Postprocedure Evaluation (Signed)
Anesthesia Post Note  Patient: Sandra Schwartz  Procedure(s) Performed: Procedure(s) (LRB): CATARACT EXTRACTION PHACO AND INTRAOCULAR LENS PLACEMENT (IOC)  Right (Right)  Patient location during evaluation: PACU Anesthesia Type: MAC Level of consciousness: awake and alert and oriented Pain management: pain level controlled Vital Signs Assessment: post-procedure vital signs reviewed and stable Respiratory status: spontaneous breathing and nonlabored ventilation Cardiovascular status: stable Postop Assessment: no signs of nausea or vomiting and adequate PO intake Anesthetic complications: no    Estill Batten

## 2016-07-07 NOTE — H&P (Signed)
The History and Physical notes are on paper, have been signed, and are to be scanned. The patient remains stable and unchanged from the H&P.   Previous H&P reviewed, patient examined, and there are no changes.  Sandra Schwartz 07/07/2016 10:14 AM

## 2016-07-07 NOTE — Transfer of Care (Signed)
Immediate Anesthesia Transfer of Care Note  Patient: Sandra Schwartz  Procedure(s) Performed: Procedure(s): CATARACT EXTRACTION PHACO AND INTRAOCULAR LENS PLACEMENT (IOC)  Right (Right)  Patient Location: PACU  Anesthesia Type: MAC  Level of Consciousness: awake, alert  and patient cooperative  Airway and Oxygen Therapy: Patient Spontanous Breathing and Patient connected to supplemental oxygen  Post-op Assessment: Post-op Vital signs reviewed, Patient's Cardiovascular Status Stable, Respiratory Function Stable, Patent Airway and No signs of Nausea or vomiting  Post-op Vital Signs: Reviewed and stable  Complications: No apparent anesthesia complications

## 2016-07-07 NOTE — Op Note (Signed)
OPERATIVE NOTE  Anastaysia Shadburn [id number] 07/07/2016   PREOPERATIVE DIAGNOSIS:  Nuclear sclerotic cataract right eye.  H25.11   POSTOPERATIVE DIAGNOSIS:     1.  Nuclear sclerotic cataract right eye.   2.  Pseudoexfoliation syndrome, floppy iris syndrome.   PROCEDURE:  Phacoemusification with posterior chamber intraocular lens placement of the right eye   LENS:   Implant Name Type Inv. Item Serial No. Manufacturer Lot No. LRB No. Used  LENS IOL DIOP 25.5 - [id number] Intraocular Lens LENS IOL DIOP 25.5 [id number]   Right 1       PCB00 +25.5   ULTRASOUND TIME: 0 minutes 37 seconds.  CDE 6.59   SURGEON:  Benay Pillow, MD, MPH  ANESTHESIOLOGIST: Anesthesiologist: Estill Batten, DO CRNA: Mayme Genta, CRNA   ANESTHESIA:  Topical with tetracaine drops augmented with 1% preservative-free intracameral lidocaine.  ESTIMATED BLOOD LOSS: less than 1 mL.   COMPLICATIONS:  None.   DESCRIPTION OF PROCEDURE:  The patient was identified in the holding room and transported to the operating room and placed in the supine position under the operating microscope.  The right eye was identified as the operative eye and it was prepped and draped in the usual sterile ophthalmic fashion.   A 1.0 millimeter clear-corneal paracentesis was made at the 10:30 position. 0.5 ml of preservative-free 1% lidocaine with epinephrine was injected into the anterior chamber.  The anterior chamber was filled with Healon 5 viscoelastic.  A 2.4 millimeter keratome was used to make a near-clear corneal incision at the 8:00 position.  A curvilinear capsulorrhexis was made with a cystotome and capsulorrhexis forceps.  Balanced salt solution was used to hydrodissect and hydrodelineate the nucleus.  The iris was mildly floppy, miotic, and had marginal transillumination defects.  There was no iris prolapse or complication from this.   Phacoemulsification was then used in stop and chop fashion to remove the lens nucleus  and epinucleus.  The remaining cortex was then removed using the irrigation and aspiration handpiece. Healon was then placed into the capsular bag to distend it for lens placement.  A lens was then injected into the capsular bag.  The remaining viscoelastic was aspirated.   Wounds were hydrated with balanced salt solution.  The anterior chamber was inflated to a physiologic pressure with balanced salt solution.    Intracameral vigamox 0.1 mL undiluted was injected into the eye and a drop placed onto the ocular surface.  No wound leaks were noted.  The patient was taken to the recovery room in stable condition without complications of anesthesia or surgery  Benay Pillow 07/07/2016, 10:44 AM

## 2016-07-07 NOTE — Anesthesia Procedure Notes (Signed)
Procedure Name: MAC Performed by: Mayme Genta Pre-anesthesia Checklist: Patient identified, Emergency Drugs available, Suction available, Timeout performed and Patient being monitored Patient Re-evaluated:Patient Re-evaluated prior to inductionOxygen Delivery Method: Nasal cannula Placement Confirmation: positive ETCO2

## 2016-07-08 ENCOUNTER — Encounter: Payer: Self-pay | Admitting: Ophthalmology

## 2016-07-09 ENCOUNTER — Encounter: Payer: Medicare Other | Admitting: Internal Medicine

## 2016-07-20 DIAGNOSIS — R748 Abnormal levels of other serum enzymes: Secondary | ICD-10-CM | POA: Diagnosis not present

## 2016-09-04 ENCOUNTER — Other Ambulatory Visit: Payer: Self-pay | Admitting: Internal Medicine

## 2016-09-04 DIAGNOSIS — Z1231 Encounter for screening mammogram for malignant neoplasm of breast: Secondary | ICD-10-CM

## 2016-09-08 ENCOUNTER — Other Ambulatory Visit: Payer: Self-pay | Admitting: Internal Medicine

## 2016-09-17 ENCOUNTER — Encounter: Payer: Self-pay | Admitting: Emergency Medicine

## 2016-09-17 ENCOUNTER — Emergency Department
Admission: EM | Admit: 2016-09-17 | Discharge: 2016-09-17 | Disposition: A | Discharge status: Home / Self Care | Payer: Medicare Other | Attending: Emergency Medicine | Admitting: Emergency Medicine

## 2016-09-17 DIAGNOSIS — M549 Dorsalgia, unspecified: Secondary | ICD-10-CM

## 2016-09-17 DIAGNOSIS — J449 Chronic obstructive pulmonary disease, unspecified: Secondary | ICD-10-CM | POA: Insufficient documentation

## 2016-09-17 DIAGNOSIS — I110 Hypertensive heart disease with heart failure: Secondary | ICD-10-CM | POA: Insufficient documentation

## 2016-09-17 DIAGNOSIS — J45909 Unspecified asthma, uncomplicated: Secondary | ICD-10-CM | POA: Insufficient documentation

## 2016-09-17 DIAGNOSIS — I509 Heart failure, unspecified: Secondary | ICD-10-CM | POA: Diagnosis not present

## 2016-09-17 DIAGNOSIS — R21 Rash and other nonspecific skin eruption: Secondary | ICD-10-CM | POA: Diagnosis not present

## 2016-09-17 DIAGNOSIS — M545 Low back pain: Secondary | ICD-10-CM | POA: Insufficient documentation

## 2016-09-17 DIAGNOSIS — F17290 Nicotine dependence, other tobacco product, uncomplicated: Secondary | ICD-10-CM | POA: Diagnosis not present

## 2016-09-17 MED ORDER — ACYCLOVIR 5 % EX OINT: 1.0000 "application " | TOPICAL_OINTMENT | CUTANEOUS | 0 refills | Status: DC

## 2016-09-17 MED ORDER — DOXYCYCLINE HYCLATE 100 MG PO CAPS: 100.0000 mg | ORAL_CAPSULE | Freq: Two times a day (BID) | ORAL | 0 refills | Status: DC

## 2016-09-17 MED ORDER — GABAPENTIN 300 MG PO CAPS: 300.0000 mg | ORAL_CAPSULE | Freq: Three times a day (TID) | ORAL | 0 refills | Status: DC

## 2016-09-17 NOTE — ED Triage Notes (Signed)
Pt to ED c/o rash to right hip that blisters and have opened up with yellow drainage as well as mid back pain for 3 days.  Denies n/v/d, denies fevers, denies SOB.

## 2016-09-17 NOTE — Discharge Instructions (Signed)
Increase the prednisone to (1) 10mg  tablet daily for the next week. Call and schedule an appointment with dermatology. Return to the ER for symptoms that change or worsen if unable to schedule an appointment.

## 2016-09-17 NOTE — ED Notes (Signed)
Patient has left shoulder pain that is sore with palpation and moving her arm. Also has a red rash on her right buttock area. One patch is red with a flaky dry spot in the middle of it. Patient reports it is itchy.

## 2016-09-18 NOTE — ED Provider Notes (Signed)
[facility] Emergency Department Provider Note  ____________________________________________  Time seen: Approximately 4:40 PM  I have reviewed the triage vital signs and the nursing notes.   HISTORY  Chief Complaint Rash and Back Pain   HPI Charizma Gardiner is a 71 y.o. female who presents to the emergency department for evaluation of recurrent rash on the right hip/buttock and lower back pain. She has had this rash recur over the past 4 years and no one has been able to figure out what is causing it. She does state that she had shingles several years ago, but the lesions look different and the pain is different. When lesions start, they are "as yellow as a banana" and are blister-like. They itch and burn. Antibiotics and steroid cream has helped in the past. She has not been evaluated by a dermatologist.   Past Medical History:  Diagnosis Date  . Allergy   . Arthritis   . Asthma   . CHF (congestive heart failure) ([place])   . Emphysema of lung ([place])   . Neuromuscular disorder ([place])   . Osteoporosis   . Wears dentures    full upper    Patient Active Problem List   Diagnosis Date Noted  . Rash and nonspecific skin eruption 01/03/2016  . COPD (chronic obstructive pulmonary disease) ([place]) 06/13/2015  . Generalized anxiety disorder 06/13/2015  . Hyperlipidemia 06/13/2015  . Rheumatoid arthritis ([place]) 06/13/2015  . Thyroid disorder 06/13/2015  . Essential hypertension 06/13/2015  . Atrophic vaginitis 06/13/2015    Past Surgical History:  Procedure Laterality Date  . APPENDECTOMY    . CATARACT EXTRACTION W/PHACO Left 06/16/2016   Procedure: CATARACT EXTRACTION PHACO AND INTRAOCULAR LENS PLACEMENT ([place])  Left;  Surgeon: Eulogio Bear, MD;  Location: [facility];  Service: Ophthalmology;  Laterality: Left;  Left eye IVA Topical  . CATARACT EXTRACTION W/PHACO Right 07/07/2016   Procedure: CATARACT EXTRACTION PHACO AND INTRAOCULAR LENS PLACEMENT ([place])   Right;  Surgeon: Eulogio Bear, MD;  Location: [facility];  Service: Ophthalmology;  Laterality: Right;  . COLON SURGERY    . SMALL INTESTINE SURGERY      Prior to Admission medications   Medication Sig Start Date End Date Taking? Authorizing Provider  acyclovir ointment (ZOVIRAX) 5 % Apply 1 application topically every 4 (four) hours. 09/17/16   Victorino Dike, FNP  doxycycline (VIBRAMYCIN) 100 MG capsule Take 1 capsule (100 mg total) by mouth 2 (two) times daily. 09/17/16   Victorino Dike, FNP  folic acid (FOLVITE) 1 MG tablet Take 1 mg by mouth daily.    Historical Provider, MD  gabapentin (NEURONTIN) 300 MG capsule Take 1 capsule (300 mg total) by mouth 3 (three) times daily. 09/17/16 09/17/17  Victorino Dike, FNP  methotrexate (RHEUMATREX) 2.5 MG tablet  06/05/16   Historical Provider, MD  pantoprazole (PROTONIX) 40 MG tablet Take 1 tablet (40 mg total) by mouth daily. 06/19/16   Hoyt Koch, MD  predniSONE (DELTASONE) 10 MG tablet Take 5 mg by mouth daily with breakfast.     Historical Provider, MD  SPIRIVA HANDIHALER 18 MCG inhalation capsule INHALE CONTENTS OF CAPSULE VIA HANDIHALER EVERY DAY 09/08/16   Hoyt Koch, MD  traMADol (ULTRAM-ER) 100 MG 24 hr tablet Take 1 tablet (100 mg total) by mouth daily. 06/19/16   Hoyt Koch, MD  triamcinolone cream (KENALOG) 0.1 % Apply 1 application topically 2 (two) times daily. Patient not taking: Reported on 07/07/2016 01/02/16  Hoyt Koch, MD    Allergies Codeine  Family History  Problem Relation Age of Onset  . Arthritis Mother   . Hypertension Mother   . Arthritis Father   . Cancer Father     colon  . Hypertension Father     Social History Social History  Substance Use Topics  . Smoking status: Former Smoker    Quit date: 05/01/2007  . Smokeless tobacco: Current User    Types: Snuff  . Alcohol use No    Review of Systems  Constitutional: Negative for fever/chills Respiratory:  Negative for shortness of breath. Musculoskeletal: Positive for pain. Skin: Positive for rash on right buttock/hip. Neurological: Negative for headaches, focal weakness or numbness. ____________________________________________   PHYSICAL EXAM:  VITAL SIGNS: ED Triage Vitals  Enc Vitals Group     BP 09/17/16 1541 (!) 145/70     Pulse Rate 09/17/16 1541 73     Resp 09/17/16 1541 16     Temp 09/17/16 1541 98.7 F (37.1 C)     Temp Source 09/17/16 1541 Oral     SpO2 09/17/16 1541 92 %     Weight 09/17/16 1538 153 lb (69.4 kg)     Height 09/17/16 1538 5' (1.524 m)     Head Circumference --      Peak Flow --      Pain Score 09/17/16 1538 10     Pain Loc --      Pain Edu? --      Excl. in [place]? --      Constitutional: Alert and oriented. Well appearing and in no acute distress. Eyes: Conjunctivae are normal. EOMI. Nose: No congestion/rhinnorhea. Mouth/Throat: Mucous membranes are moist.   Neck: No stridor. Lymphatic: No cervical lymphadenopathy. Cardiovascular: Good peripheral circulation. Respiratory: Normal respiratory effort.  No retractions. Musculoskeletal: FROM throughout. No focal/midline tenderness of the lower back/right hip. Neurologic:  Normal speech and language. No gross focal neurologic deficits are appreciated. Skin:  Maculopapular, annual, erythematous lesions on the right buttock in various stages. A single area has tiny, purulent vesicles diffusely over the area of erythema.   ____________________________________________   LABS (all labs ordered are listed, but only abnormal results are displayed)  Labs Reviewed - No data to display ____________________________________________  EKG   ____________________________________________  RADIOLOGY  Not indicated.  ____________________________________________   PROCEDURES  Procedure(s) performed: None ____________________________________________   INITIAL IMPRESSION / ASSESSMENT AND PLAN / ED  COURSE  71 year old female presenting to the emergency department for evaluation of pruritic/painful rash that is recurrent on her right hip and buttock. She also states that this causes some pain in her lower back. She does have a history of shingles infection but states that this was many many years ago and the pain and rash are different. This rash has been evaluated by her rheumatologist, primary care provider, and home health care nurses in the past. No one has been able to determine the cause. She states that antibiotics and hydrocortisone cream typically relieved the symptoms, but about 2 or 3 months later they return. She'll be treated with gabapentin, Zovirax ointment, and doxycycline. She was instructed to call and schedule a follow-up appointment with dermatology. She was advised to continue her home medications. She was advised to return to the emergency department for symptoms that change or worsen if she is unable schedule an appointment.  Pertinent labs & imaging results that were available during my care of the patient were reviewed by me and considered in my  medical decision making (see chart for details).   ____________________________________________   FINAL CLINICAL IMPRESSION(S) / ED DIAGNOSES  Final diagnoses:  Rash and nonspecific skin eruption  Back pain, unspecified back location, unspecified back pain laterality, unspecified chronicity    Discharge Medication List as of 09/17/2016  4:45 PM    START taking these medications   Details  acyclovir ointment (ZOVIRAX) 5 % Apply 1 application topically every 4 (four) hours., Starting Thu 09/17/2016, Print    doxycycline (VIBRAMYCIN) 100 MG capsule Take 1 capsule (100 mg total) by mouth 2 (two) times daily., Starting Thu 09/17/2016, Print    gabapentin (NEURONTIN) 300 MG capsule Take 1 capsule (300 mg total) by mouth 3 (three) times daily., Starting Thu 09/17/2016, Until Fri 09/17/2017, Print        If controlled substance  prescribed during this visit, 12 month history viewed on the [place] prior to issuing an initial prescription for Schedule II or III opiod.   Note:  This document was prepared using Dragon voice recognition software and may include unintentional dictation errors.    Victorino Dike, FNP 09/18/16 1729    Eula Listen, MD 09/22/16 1105

## 2016-09-23 ENCOUNTER — Ambulatory Visit: Payer: Medicare Other

## 2016-09-23 DIAGNOSIS — M059 Rheumatoid arthritis with rheumatoid factor, unspecified: Secondary | ICD-10-CM | POA: Diagnosis not present

## 2016-09-23 DIAGNOSIS — Z79899 Other long term (current) drug therapy: Secondary | ICD-10-CM | POA: Diagnosis not present

## 2016-09-24 ENCOUNTER — Ambulatory Visit: Payer: Medicare Other | Admitting: Internal Medicine

## 2016-09-29 ENCOUNTER — Ambulatory Visit: Payer: Medicare Other

## 2016-09-29 ENCOUNTER — Emergency Department
Admission: EM | Admit: 2016-09-29 | Discharge: 2016-09-29 | Disposition: A | Discharge status: Home / Self Care | Payer: Medicare Other | Attending: Emergency Medicine | Admitting: Emergency Medicine

## 2016-09-29 ENCOUNTER — Emergency Department: Payer: Medicare Other

## 2016-09-29 DIAGNOSIS — I700 Atherosclerosis of aorta: Secondary | ICD-10-CM | POA: Diagnosis not present

## 2016-09-29 DIAGNOSIS — I110 Hypertensive heart disease with heart failure: Secondary | ICD-10-CM | POA: Insufficient documentation

## 2016-09-29 DIAGNOSIS — R079 Chest pain, unspecified: Secondary | ICD-10-CM | POA: Diagnosis not present

## 2016-09-29 DIAGNOSIS — I509 Heart failure, unspecified: Secondary | ICD-10-CM | POA: Insufficient documentation

## 2016-09-29 DIAGNOSIS — J449 Chronic obstructive pulmonary disease, unspecified: Secondary | ICD-10-CM | POA: Diagnosis not present

## 2016-09-29 DIAGNOSIS — J45909 Unspecified asthma, uncomplicated: Secondary | ICD-10-CM | POA: Diagnosis not present

## 2016-09-29 DIAGNOSIS — F17290 Nicotine dependence, other tobacco product, uncomplicated: Secondary | ICD-10-CM | POA: Insufficient documentation

## 2016-09-29 DIAGNOSIS — R0789 Other chest pain: Secondary | ICD-10-CM | POA: Diagnosis not present

## 2016-09-29 MED ORDER — NAPROXEN 500 MG PO TABS: 500.0000 mg | ORAL_TABLET | Freq: Two times a day (BID) | ORAL | 0 refills | Status: DC

## 2016-09-29 NOTE — ED Triage Notes (Signed)
Pt was seen in the er one week ago and given medication for lower back pain - over the last week the pain has radiate into hip and up back - nothing is relieving the pain and pt states she feels like she has a pulled muscle - however, the pt also had a cold approx one week ago and started with back pain - pt states that she had pneumonia once before and that her back hurt the same way - pt denies cough or congestion

## 2016-09-29 NOTE — ED Notes (Signed)
Patient transported to X-ray 

## 2016-09-29 NOTE — ED Provider Notes (Signed)
[facility] Emergency Department Provider Note  ____________________________________________  Time seen: Approximately 6:03 PM  I have reviewed the triage vital signs and the nursing notes.   HISTORY  Chief Complaint Back Pain    HPI Sandra Schwartz is a 71 y.o. female who complains of upper thoracic back pain for the last week, gradual onset, hurts when she coughs or moves or takes a deep breath. No shortness of breath. Denies chest pain specifically. This all started after a upper respiratory infection about a week ago. That is gotten better but now she has the back pain. This feels like an episode of pneumonia she had in the past. No fever. Eating and drinking normally. No dizziness. Pain is sharp and achy moderate in intensity. No alleviating factors.Intermittent lasting a second at a time     Past Medical History:  Diagnosis Date  . Allergy   . Arthritis   . Asthma   . CHF (congestive heart failure) ([place])   . Emphysema of lung ([place])   . Neuromuscular disorder ([place])   . Osteoporosis   . Wears dentures    full upper     Patient Active Problem List   Diagnosis Date Noted  . Rash and nonspecific skin eruption 01/03/2016  . COPD (chronic obstructive pulmonary disease) ([place]) 06/13/2015  . Generalized anxiety disorder 06/13/2015  . Hyperlipidemia 06/13/2015  . Rheumatoid arthritis ([place]) 06/13/2015  . Thyroid disorder 06/13/2015  . Essential hypertension 06/13/2015  . Atrophic vaginitis 06/13/2015     Past Surgical History:  Procedure Laterality Date  . APPENDECTOMY    . CATARACT EXTRACTION W/PHACO Left 06/16/2016   Procedure: CATARACT EXTRACTION PHACO AND INTRAOCULAR LENS PLACEMENT ([place])  Left;  Surgeon: Eulogio Bear, MD;  Location: [facility];  Service: Ophthalmology;  Laterality: Left;  Left eye IVA Topical  . CATARACT EXTRACTION W/PHACO Right 07/07/2016   Procedure: CATARACT EXTRACTION PHACO AND INTRAOCULAR LENS PLACEMENT ([place])   Right;  Surgeon: Eulogio Bear, MD;  Location: [facility];  Service: Ophthalmology;  Laterality: Right;  . COLON SURGERY    . SMALL INTESTINE SURGERY       Prior to Admission medications   Medication Sig Start Date End Date Taking? Authorizing Provider  acyclovir ointment (ZOVIRAX) 5 % Apply 1 application topically every 4 (four) hours. 09/17/16   Triplett, Johnette Abraham B, FNP  doxycycline (VIBRAMYCIN) 100 MG capsule Take 1 capsule (100 mg total) by mouth 2 (two) times daily. 09/17/16   Triplett, Johnette Abraham B, FNP  folic acid (FOLVITE) 1 MG tablet Take 1 mg by mouth daily.    [provider]  gabapentin (NEURONTIN) 300 MG capsule Take 1 capsule (300 mg total) by mouth 3 (three) times daily. 09/17/16 09/17/17  Sherrie George B, FNP  methotrexate (RHEUMATREX) 2.5 MG tablet  06/05/16   [provider]  naproxen (NAPROSYN) 500 MG tablet Take 1 tablet (500 mg total) by mouth 2 (two) times daily with a meal. 09/29/16   Carrie Mew, MD  pantoprazole (PROTONIX) 40 MG tablet Take 1 tablet (40 mg total) by mouth daily. 06/19/16   Hoyt Koch, MD  predniSONE (DELTASONE) 10 MG tablet Take 5 mg by mouth daily with breakfast.     [provider]  SPIRIVA HANDIHALER 18 MCG inhalation capsule INHALE CONTENTS OF CAPSULE VIA HANDIHALER EVERY DAY 09/08/16   Hoyt Koch, MD  traMADol (ULTRAM-ER) 100 MG 24 hr tablet Take 1 tablet (100 mg total) by mouth daily. 06/19/16   Sharlet Salina,  Real Cons, MD  triamcinolone cream (KENALOG) 0.1 % Apply 1 application topically 2 (two) times daily. Patient not taking: Reported on 07/07/2016 01/02/16   Hoyt Koch, MD     Allergies Codeine   Family History  Problem Relation Age of Onset  . Arthritis Mother   . Hypertension Mother   . Arthritis Father   . Cancer Father     colon  . Hypertension Father     Social History Social History  Substance Use Topics  . Smoking status: Former Smoker    Quit date: 05/01/2007   . Smokeless tobacco: Current User    Types: Snuff  . Alcohol use No    Review of Systems  Constitutional:   No fever or chills.  ENT:   No sore throat. No rhinorrhea. Lymphatic: No swollen glands, No extremity swelling Endocrine: No hot/cold flashes. No significant weight change. No neck swelling. Cardiovascular:   No chest pain or syncope. Respiratory:   No dyspnea or cough. Gastrointestinal:   Negative for abdominal pain, vomiting and diarrhea.  Genitourinary:   Negative for dysuria or difficulty urinating. Musculoskeletal:   Positive as above for upper back pain. Neurological:   Negative for headaches or weakness. All other systems reviewed and are negative except as documented above in ROS and HPI.  ____________________________________________   PHYSICAL EXAM:  VITAL SIGNS: ED Triage Vitals  Enc Vitals Group     BP 09/29/16 1510 (!) 152/119     Pulse Rate 09/29/16 1510 67     Resp 09/29/16 1510 17     Temp 09/29/16 1510 97.9 F (36.6 C)     Temp Source 09/29/16 1510 Oral     SpO2 09/29/16 1510 98 %     Weight 09/29/16 1511 153 lb (69.4 kg)     Height 09/29/16 1511 5' (1.524 m)     Head Circumference --      Peak Flow --      Pain Score 09/29/16 1509 10     Pain Loc --      Pain Edu? --      Excl. in [place]? --     Vital signs reviewed, nursing assessments reviewed.   Constitutional:   Alert and oriented. Well appearing and in no distress. Eyes:   No scleral icterus. No conjunctival pallor. PERRL. EOMI.  No nystagmus. ENT   Head:   Normocephalic and atraumatic.   Nose:   No congestion/rhinnorhea. No septal hematoma   Mouth/Throat:   MMM, no pharyngeal erythema. No peritonsillar mass.    Neck:   No stridor. No SubQ emphysema. No meningismus. Hematological/Lymphatic/Immunilogical:   No cervical lymphadenopathy. Cardiovascular:   RRR. Symmetric bilateral radial and DP pulses.  No murmurs.  Respiratory:   Normal respiratory effort without tachypnea nor  retractions. Breath sounds are clear and equal bilaterally. No wheezes/rales/rhonchi. Gastrointestinal:   Soft and nontender. Non distended. There is no CVA tenderness.  No rebound, rigidity, or guarding. Genitourinary:   deferred Musculoskeletal:   Normal range of motion in all extremities. No joint effusions.  No lower extremity tenderness.  No edema.Bilateral subscapular tenderness reproducing the pain. Reproduced by abducting both shoulders across the body Neurologic:   Normal speech and language.  CN 2-10 normal. Motor grossly intact. No gross focal neurologic deficits are appreciated.  Skin:    Skin is warm, dry and intact. No rash noted.  No petechiae, purpura, or bullae.  ____________________________________________    LABS (pertinent positives/negatives) (all labs ordered are listed, but only  abnormal results are displayed) Labs Reviewed - No data to display ____________________________________________   EKG    ____________________________________________    RADIOLOGY  Dg Chest 2 View  Result Date: 09/29/2016 CLINICAL DATA:  Pain EXAM: CHEST  2 VIEW COMPARISON:  None. FINDINGS: There is no edema or consolidation. Heart size and pulmonary vascularity are normal. No adenopathy. There is aortic atherosclerosis. There is calcification in each carotid artery. No bone lesions. IMPRESSION: Aortic atherosclerosis. Calcification in each carotid artery. No edema or consolidation. Electronically Signed   By: Lowella Grip III M.D.   On: 09/29/2016 17:44    ____________________________________________   PROCEDURES Procedures  ____________________________________________   INITIAL IMPRESSION / ASSESSMENT AND PLAN / ED COURSE  Pertinent labs & imaging results that were available during my care of the patient were reviewed by me and considered in my medical decision making (see chart for details).   Clinical Course as of Sep 29 1801  Tue Sep 29, 2016  1702 Pt presents  with MSK chest wall pain bilaterally. With cough, recent URI, and h/o pna, will check CXR.   [PS]    Clinical Course User Index [PS] Carrie Mew, MD     ----------------------------------------- 6:45 PM on 09/29/2016 -----------------------------------------  Chest x-ray negative. Naprosyn for pain. Follow-up with primary care.Considering the patient's symptoms, medical history, and physical examination today, I have low suspicion for ACS, PE, TAD, pneumothorax, carditis, mediastinitis, pneumonia, CHF, or sepsis.    ____________________________________________   FINAL CLINICAL IMPRESSION(S) / ED DIAGNOSES  Final diagnoses:  Chest wall pain      New Prescriptions   NAPROXEN (NAPROSYN) 500 MG TABLET    Take 1 tablet (500 mg total) by mouth 2 (two) times daily with a meal.     Portions of this note were generated with dragon dictation software. Dictation errors may occur despite best attempts at proofreading.    Carrie Mew, MD 09/29/16 [phone]

## 2016-09-29 NOTE — Discharge Instructions (Signed)
Your chest xray did not show any acute issues today.

## 2016-10-01 ENCOUNTER — Emergency Department (HOSPITAL_COMMUNITY)
Admission: EM | Admit: 2016-10-01 | Discharge: 2016-10-01 | Disposition: A | Discharge status: Home / Self Care | Payer: Medicare Other | Attending: Emergency Medicine | Admitting: Emergency Medicine

## 2016-10-01 ENCOUNTER — Encounter (HOSPITAL_COMMUNITY): Payer: Self-pay | Admitting: Emergency Medicine

## 2016-10-01 DIAGNOSIS — S299XXA Unspecified injury of thorax, initial encounter: Secondary | ICD-10-CM | POA: Diagnosis present

## 2016-10-01 DIAGNOSIS — J449 Chronic obstructive pulmonary disease, unspecified: Secondary | ICD-10-CM | POA: Insufficient documentation

## 2016-10-01 DIAGNOSIS — S29019A Strain of muscle and tendon of unspecified wall of thorax, initial encounter: Secondary | ICD-10-CM

## 2016-10-01 DIAGNOSIS — Y929 Unspecified place or not applicable: Secondary | ICD-10-CM | POA: Insufficient documentation

## 2016-10-01 DIAGNOSIS — Z79899 Other long term (current) drug therapy: Secondary | ICD-10-CM | POA: Insufficient documentation

## 2016-10-01 DIAGNOSIS — I110 Hypertensive heart disease with heart failure: Secondary | ICD-10-CM | POA: Diagnosis not present

## 2016-10-01 DIAGNOSIS — X500XXA Overexertion from strenuous movement or load, initial encounter: Secondary | ICD-10-CM | POA: Insufficient documentation

## 2016-10-01 DIAGNOSIS — S29012A Strain of muscle and tendon of back wall of thorax, initial encounter: Secondary | ICD-10-CM | POA: Insufficient documentation

## 2016-10-01 DIAGNOSIS — Y999 Unspecified external cause status: Secondary | ICD-10-CM | POA: Diagnosis not present

## 2016-10-01 DIAGNOSIS — Z87891 Personal history of nicotine dependence: Secondary | ICD-10-CM | POA: Diagnosis not present

## 2016-10-01 DIAGNOSIS — Y9389 Activity, other specified: Secondary | ICD-10-CM | POA: Diagnosis not present

## 2016-10-01 DIAGNOSIS — I509 Heart failure, unspecified: Secondary | ICD-10-CM | POA: Insufficient documentation

## 2016-10-01 MED ORDER — HYDROCODONE-ACETAMINOPHEN 5-325 MG PO TABS: 1.0000 | ORAL_TABLET | ORAL | 0 refills | Status: DC | PRN

## 2016-10-01 MED ORDER — HYDROCODONE-ACETAMINOPHEN 5-325 MG PO TABS
2.0000 | ORAL_TABLET | Freq: Once | ORAL | Status: AC
  Administered 2016-10-01: 2 via ORAL
  Filled 2016-10-01: qty 2

## 2016-10-01 NOTE — ED Notes (Signed)
Bed: WA05 Expected date:  Expected time:  Means of arrival:  Comments: 

## 2016-10-01 NOTE — ED Triage Notes (Signed)
Pt c/o back pain x > 1 week, thoracic pain, pt states under scapula bilateral. Pt has been seen for back pain at [facility] and given naprosyn and pt states no relief with pain meds. Pt states no pain while lying still but pain becomes severe with movement.

## 2016-10-01 NOTE — ED Provider Notes (Signed)
[facility] DEPT Provider Note   CSN: [id number] Arrival date & time: 10/01/16  1048     History   Chief Complaint Chief Complaint  Patient presents with  . Back Pain    HPI Sandra Schwartz is a 71 y.o. female.  HPI Patient reports she was seen for thoracic back pain 2 days ago and given naproxen and prednisone to take. She reports is not helping and she is still having a lot of pain behind both shoulder blades. She reports this morning it was really hard for her to even get out of bed because of the pain. Pain is worse with any twisting or bending or deep breath. No associated fever or cough. No associated lower extremity symptoms. The patient reports that about 5 days ago she picked up a wet rug that was heavy and she felt a pull in her right lower back. She reports by the next morning the pain was all through her thoracic back and has been constant since. Patient reports she has had similar pain with a back strain in the past and ultimately it went away with pain medication. Patient takes daily tramadol 100 mg extended release for chronic musculoskeletal skeletal pain and arthritis. Past Medical History:  Diagnosis Date  . Allergy   . Arthritis   . Asthma   . CHF (congestive heart failure) ([place])   . Emphysema of lung ([place])   . Neuromuscular disorder ([place])   . Osteoporosis   . Wears dentures    full upper    Patient Active Problem List   Diagnosis Date Noted  . Rash and nonspecific skin eruption 01/03/2016  . COPD (chronic obstructive pulmonary disease) ([place]) 06/13/2015  . Generalized anxiety disorder 06/13/2015  . Hyperlipidemia 06/13/2015  . Rheumatoid arthritis ([place]) 06/13/2015  . Thyroid disorder 06/13/2015  . Essential hypertension 06/13/2015  . Atrophic vaginitis 06/13/2015    Past Surgical History:  Procedure Laterality Date  . APPENDECTOMY    . CATARACT EXTRACTION W/PHACO Left 06/16/2016   Procedure: CATARACT EXTRACTION PHACO AND INTRAOCULAR LENS PLACEMENT  ([place])  Left;  Surgeon: Eulogio Bear, MD;  Location: [facility];  Service: Ophthalmology;  Laterality: Left;  Left eye IVA Topical  . CATARACT EXTRACTION W/PHACO Right 07/07/2016   Procedure: CATARACT EXTRACTION PHACO AND INTRAOCULAR LENS PLACEMENT ([place])  Right;  Surgeon: Eulogio Bear, MD;  Location: [facility];  Service: Ophthalmology;  Laterality: Right;  . COLON SURGERY    . SMALL INTESTINE SURGERY      OB History    No data available       Home Medications    Prior to Admission medications   Medication Sig Start Date End Date Taking? Authorizing Provider  cholecalciferol (VITAMIN D) 1000 units tablet Take 1,000 Units by mouth daily.   Yes [provider]  folic acid (FOLVITE) 1 MG tablet Take 1 mg by mouth daily.   Yes [provider]  gabapentin (NEURONTIN) 300 MG capsule Take 1 capsule (300 mg total) by mouth 3 (three) times daily. 09/17/16 09/17/17 Yes Triplett, Cari B, FNP  methotrexate (RHEUMATREX) 2.5 MG tablet Take 5-7.5 mg by mouth 2 (two) times a week. Pt takes three tablets once daily on Sunday and two once daily on Monday.   Yes [provider]  naproxen (NAPROSYN) 500 MG tablet Take 1 tablet (500 mg total) by mouth 2 (two) times daily with a meal. 09/29/16  Yes Carrie Mew, MD  pantoprazole (PROTONIX) 40 MG tablet Take 1 tablet (  40 mg total) by mouth daily. 06/19/16  Yes Hoyt Koch, MD  predniSONE (DELTASONE) 10 MG tablet Take 10-50 mg by mouth daily with breakfast. Pt is to take as a taper:  Five tablets for 2 days, four tablets for 2 days, three tablets for 2 days, two tablets for 2 days, one tablet for 2 days, then STOP. 09/24/16 10/04/16 Yes [provider]  tiotropium (SPIRIVA HANDIHALER) 18 MCG inhalation capsule Place 18 mcg into inhaler and inhale at bedtime.   Yes [provider]  traMADol (ULTRAM-ER) 100 MG 24 hr tablet Take 1 tablet (100 mg total) by mouth daily. 06/19/16  Yes Hoyt Koch, MD  vitamin B-12 (CYANOCOBALAMIN) 1000 MCG tablet Take 1,000 mcg by mouth daily.   Yes [provider]  doxycycline (VIBRAMYCIN) 100 MG capsule Take 1 capsule (100 mg total) by mouth 2 (two) times daily. Patient not taking: Reported on 10/01/2016 09/17/16   Sherrie George B, FNP  HYDROcodone-acetaminophen (NORCO/VICODIN) 5-325 MG tablet Take 1-2 tablets by mouth every 4 (four) hours as needed for moderate pain or severe pain. 10/01/16   Charlesetta Shanks, MD    Family History Family History  Problem Relation Age of Onset  . Arthritis Mother   . Hypertension Mother   . Arthritis Father   . Cancer Father        colon  . Hypertension Father     Social History Social History  Substance Use Topics  . Smoking status: Former Smoker    Quit date: 05/01/2007  . Smokeless tobacco: Current User    Types: Snuff  . Alcohol use No     Allergies   Codeine   Review of Systems Review of Systems 10 Systems reviewed and are negative for acute change except as noted in the HPI.   Physical Exam Updated Vital Signs BP (!) 160/84 (BP Location: Left Arm)   Pulse 71   Temp 97.8 F (36.6 C) (Oral)   Resp 20   Ht 5' (1.524 m)   Wt 153 lb (69.4 kg)   SpO2 99%   BMI 29.88 kg/m   Physical Exam  Constitutional: She appears well-developed and well-nourished. No distress.  HENT:  Head: Normocephalic and atraumatic.  Eyes: Conjunctivae and EOM are normal.  Neck: Neck supple.  Cardiovascular: Normal rate, regular rhythm, normal heart sounds and intact distal pulses.   No murmur heard. Pulmonary/Chest: Effort normal and breath sounds normal. No respiratory distress.  Abdominal: Soft. She exhibits no distension. There is no tenderness. There is no guarding.  Musculoskeletal: She exhibits tenderness. She exhibits no edema.  Patient is exquisitely tender to palpation of the paraspinous muscle bodies from the cervical spine to the mid thoracic spine. With elevation of the arms  and palpation in the area of the subscapularis and mid thoracic chest wall patient has exquisitely reproducible pain. This is bilateral but left slightly greater than right. Lower extremity is are normal with no peripheral edema and no calf tenderness.  Neurological: She is alert. She exhibits normal muscle tone. Coordination normal.  Skin: Skin is warm and dry.  Psychiatric: She has a normal mood and affect.  Nursing note and vitals reviewed.    ED Treatments / Results  Labs (all labs ordered are listed, but only abnormal results are displayed) Labs Reviewed - No data to display  EKG  EKG Interpretation None       Radiology Dg Chest 2 View  Result Date: 09/29/2016 CLINICAL DATA:  Pain EXAM: CHEST  2 VIEW COMPARISON:  None. FINDINGS: There is no edema or consolidation. Heart size and pulmonary vascularity are normal. No adenopathy. There is aortic atherosclerosis. There is calcification in each carotid artery. No bone lesions. IMPRESSION: Aortic atherosclerosis. Calcification in each carotid artery. No edema or consolidation. Electronically Signed   By: Lowella Grip III M.D.   On: 09/29/2016 17:44    Procedures Procedures (including critical care time)  Medications Ordered in ED Medications  HYDROcodone-acetaminophen (NORCO/VICODIN) 5-325 MG per tablet 2 tablet (not administered)     Initial Impression / Assessment and Plan / ED Course  I have reviewed the triage vital signs and the nursing notes.  Pertinent labs & imaging results that were available during my care of the patient were reviewed by me and considered in my medical decision making (see chart for details).      Final Clinical Impressions(s) / ED Diagnoses   Final diagnoses:  Acute thoracic myofascial strain, initial encounter  History is consistent with thoracic strain by trying to pick up a wet rug. Patient's pain is very reproducible in the musculoskeletal areas of the paraspinous and subscapular  muscles. Patient clinically is otherwise well. She has not been adequate pain control with naproxen and her extended release tramadol. At this time, due to the overlap in anti-inflammatories and slight elevation the patient's blood pressure, I will have her discontinue the naproxen and finished the prednisone that had been prescribed 2 days ago. I will add when necessary Vicodin and have her continue her tramadol that she takes chronicallyusing the Vicodin for breakthrough pain.  New Prescriptions New Prescriptions   HYDROCODONE-ACETAMINOPHEN (NORCO/VICODIN) 5-325 MG TABLET    Take 1-2 tablets by mouth every 4 (four) hours as needed for moderate pain or severe pain.     Charlesetta Shanks, MD 10/01/16 [id number]

## 2016-10-01 NOTE — Discharge Instructions (Signed)
At this time, discontinue the naproxen as your blood pressure is slightly elevated.  Finished the prednisone that you were prescribed earlier.  Continue your daily Tramadol as prescribed. Take an addition 1 every 4-6 hours as needed for additional pain control.  See your doctor for recheck the end of the week.

## 2016-10-06 DIAGNOSIS — Z7952 Long term (current) use of systemic steroids: Secondary | ICD-10-CM | POA: Diagnosis not present

## 2016-10-06 DIAGNOSIS — M059 Rheumatoid arthritis with rheumatoid factor, unspecified: Secondary | ICD-10-CM | POA: Diagnosis not present

## 2016-10-06 DIAGNOSIS — Z79899 Other long term (current) drug therapy: Secondary | ICD-10-CM | POA: Diagnosis not present

## 2016-10-06 DIAGNOSIS — M810 Age-related osteoporosis without current pathological fracture: Secondary | ICD-10-CM | POA: Diagnosis not present

## 2016-10-08 ENCOUNTER — Encounter: Payer: Self-pay | Admitting: Internal Medicine

## 2016-10-08 ENCOUNTER — Ambulatory Visit (INDEPENDENT_AMBULATORY_CARE_PROVIDER_SITE_OTHER): Payer: Medicare Other | Admitting: Internal Medicine

## 2016-10-08 DIAGNOSIS — M069 Rheumatoid arthritis, unspecified: Secondary | ICD-10-CM | POA: Diagnosis not present

## 2016-10-08 DIAGNOSIS — M546 Pain in thoracic spine: Secondary | ICD-10-CM | POA: Diagnosis not present

## 2016-10-08 MED ORDER — TRAMADOL HCL ER 100 MG PO TB24: 100.0000 mg | ORAL_TABLET | Freq: Every day | ORAL | 2 refills | Status: DC

## 2016-10-08 NOTE — Progress Notes (Signed)
   Subjective:    Patient ID: Sandra Schwartz, female    DOB: Sep 29, 1945, 71 y.o.   MRN: [id number]  HPI The patient is a 71 YO female coming in for ER follow up (seen in the ER 3 times for middle back pain, given prednisone, NSAIDs, then hydrocodone although she is chronically on tramadol ER). She has taken the prednisone and saw her rheumatologist and they are keeping her on an extended taper of the prednisone. She has rarely used the hydrocodone. Still taking the tramadol and almost out and needs refill. She is still having back pain in the middle of her back. Started when she was pulling up a rug. Gradual improvement of the pain in the last week or so since it started. Maybe 40-50% better at this time. She is not doing much of anything activity wise. She is using heat and this helps while she is using it. The pain stays in her back and does not radiate. No numbness or tingling in her arms or legs. No weakness in her arms or legs. No change to appetite.   Review of Systems  Constitutional: Positive for activity change. Negative for appetite change, fatigue, fever and unexpected weight change.  Respiratory: Negative.   Cardiovascular: Negative.   Gastrointestinal: Negative.   Musculoskeletal: Positive for arthralgias, back pain, joint swelling and myalgias. Negative for gait problem, neck pain and neck stiffness.  Skin: Negative.   Neurological: Negative.       Objective:   Physical Exam  Constitutional: She is oriented to person, place, and time. She appears well-developed and well-nourished.  HENT:  Head: Normocephalic and atraumatic.  Eyes: EOM are normal.  Neck: Normal range of motion. Neck supple.  Cardiovascular: Normal rate and regular rhythm.   Pulmonary/Chest: Effort normal.  Abdominal: Soft.  Musculoskeletal: She exhibits edema and tenderness.  Swollen hands, stable. Paraspinal thoracic muscular pain.  Neurological: She is alert and oriented to person, place, and time. No cranial  nerve deficit. Coordination normal.  Skin: Skin is warm and dry.   Vitals:   10/08/16 1356 10/08/16 1425  BP: (!) 160/100 (!) 152/82  Pulse: 74   Resp: 12   Temp: 98.3 F (36.8 C)   TempSrc: Oral   SpO2: 99%   Weight: 152 lb (68.9 kg)   Height: 5' (1.524 m)       Assessment & Plan:

## 2016-10-08 NOTE — Patient Instructions (Signed)
We have refilled the tramadol today. Keep using this and the tylenol for the pain. It is okay to use the heat as well.

## 2016-10-09 DIAGNOSIS — M546 Pain in thoracic spine: Secondary | ICD-10-CM | POA: Insufficient documentation

## 2016-10-09 NOTE — Assessment & Plan Note (Signed)
Refilled her tramadol ER today and reminded her that she is not allowed to fill other controlled substances from other providers without notifying the office prior to fill which includes ER visits.

## 2016-10-09 NOTE — Assessment & Plan Note (Signed)
She will use the tramadol ER for pain and encouraged not to use hydrocodone at the same time. She can use heat as well since pain is improving this should be adequate.

## 2016-10-10 ENCOUNTER — Other Ambulatory Visit: Payer: Self-pay | Admitting: Internal Medicine

## 2016-10-20 ENCOUNTER — Ambulatory Visit
Admission: RE | Admit: 2016-10-20 | Discharge: 2016-10-20 | Disposition: A | Discharge status: Home / Self Care | Payer: Medicare Other | Source: Ambulatory Visit | Attending: Internal Medicine | Admitting: Internal Medicine

## 2016-10-20 DIAGNOSIS — Z1231 Encounter for screening mammogram for malignant neoplasm of breast: Secondary | ICD-10-CM

## 2016-11-10 ENCOUNTER — Other Ambulatory Visit: Payer: Self-pay | Admitting: Internal Medicine

## 2016-11-10 NOTE — Telephone Encounter (Signed)
Routing to greg, i'm not showing dr crawford prescribed this med for the patient--please advise, thanks

## 2017-01-04 ENCOUNTER — Other Ambulatory Visit: Payer: Self-pay | Admitting: Family

## 2017-01-05 DIAGNOSIS — M059 Rheumatoid arthritis with rheumatoid factor, unspecified: Secondary | ICD-10-CM | POA: Diagnosis not present

## 2017-01-05 DIAGNOSIS — Z7952 Long term (current) use of systemic steroids: Secondary | ICD-10-CM | POA: Diagnosis not present

## 2017-01-05 DIAGNOSIS — M810 Age-related osteoporosis without current pathological fracture: Secondary | ICD-10-CM | POA: Diagnosis not present

## 2017-01-05 DIAGNOSIS — Z79899 Other long term (current) drug therapy: Secondary | ICD-10-CM | POA: Diagnosis not present

## 2017-02-02 ENCOUNTER — Other Ambulatory Visit: Payer: Self-pay | Admitting: Internal Medicine

## 2017-02-10 ENCOUNTER — Other Ambulatory Visit: Payer: Self-pay | Admitting: Internal Medicine

## 2017-02-10 ENCOUNTER — Telehealth: Payer: Self-pay | Admitting: Internal Medicine

## 2017-02-10 DIAGNOSIS — M069 Rheumatoid arthritis, unspecified: Secondary | ICD-10-CM

## 2017-02-10 NOTE — Telephone Encounter (Signed)
Pt would like a refill of her traMADol (ULTRAM-ER) 100 MG 24 hr tablet

## 2017-02-11 MED ORDER — TRAMADOL HCL ER 100 MG PO TB24: 100.0000 mg | ORAL_TABLET | Freq: Every day | ORAL | 2 refills | Status: DC

## 2017-02-11 NOTE — Telephone Encounter (Signed)
Called pt no answer LMOM [facility].../lmb

## 2017-02-11 NOTE — Telephone Encounter (Signed)
Printed and signed.  

## 2017-02-25 ENCOUNTER — Other Ambulatory Visit: Payer: Self-pay

## 2017-02-25 MED ORDER — TIOTROPIUM BROMIDE MONOHYDRATE 18 MCG IN CAPS: ORAL_CAPSULE | RESPIRATORY_TRACT | 0 refills | Status: DC

## 2017-03-31 DIAGNOSIS — M2042 Other hammer toe(s) (acquired), left foot: Secondary | ICD-10-CM | POA: Diagnosis not present

## 2017-03-31 DIAGNOSIS — Z79899 Other long term (current) drug therapy: Secondary | ICD-10-CM | POA: Diagnosis not present

## 2017-03-31 DIAGNOSIS — M810 Age-related osteoporosis without current pathological fracture: Secondary | ICD-10-CM | POA: Diagnosis not present

## 2017-03-31 DIAGNOSIS — M059 Rheumatoid arthritis with rheumatoid factor, unspecified: Secondary | ICD-10-CM | POA: Diagnosis not present

## 2017-03-31 DIAGNOSIS — M2041 Other hammer toe(s) (acquired), right foot: Secondary | ICD-10-CM | POA: Diagnosis not present

## 2017-04-14 DIAGNOSIS — M2042 Other hammer toe(s) (acquired), left foot: Secondary | ICD-10-CM | POA: Diagnosis not present

## 2017-04-14 DIAGNOSIS — B351 Tinea unguium: Secondary | ICD-10-CM | POA: Diagnosis not present

## 2017-04-14 DIAGNOSIS — M79675 Pain in left toe(s): Secondary | ICD-10-CM | POA: Diagnosis not present

## 2017-04-14 DIAGNOSIS — M2041 Other hammer toe(s) (acquired), right foot: Secondary | ICD-10-CM | POA: Diagnosis not present

## 2017-04-14 DIAGNOSIS — M2012 Hallux valgus (acquired), left foot: Secondary | ICD-10-CM | POA: Diagnosis not present

## 2017-04-23 ENCOUNTER — Telehealth: Payer: Self-pay | Admitting: Internal Medicine

## 2017-04-23 NOTE — Telephone Encounter (Signed)
Copied from [place] [id number]. Topic: Inquiry >> Apr 23, 2017  4:13 PM Neva Seat wrote:  [facility] [address] [facility] haven't received the refill request Pantoprazole 40 mg - none left - needs refilled ASAP - pt hasn't taken Rx in 2 days.

## 2017-04-26 ENCOUNTER — Other Ambulatory Visit: Payer: Self-pay

## 2017-04-26 MED ORDER — PANTOPRAZOLE SODIUM 40 MG PO TBEC: 40.0000 mg | DELAYED_RELEASE_TABLET | Freq: Every day | ORAL | 1 refills | Status: DC

## 2017-04-26 NOTE — Telephone Encounter (Signed)
Rx sent to [facility] [address] [facility]

## 2017-04-26 NOTE — Telephone Encounter (Signed)
No notes in office visit that GERD has been addressed.  Pt's. CPE 06/2016 was cancelled.  Please advise if this medication can be refilled.  Thank you.

## 2017-06-10 NOTE — Progress Notes (Signed)
Subjective:   Sandra Schwartz is a 72 y.o. female who presents for an Initial Medicare Annual Wellness Visit.  Review of Systems    No ROS.  Medicare Wellness Visit. Additional risk factors are reflected in the social history.   Cardiac Risk Factors include: dyslipidemia;hypertension Sleep patterns: feels rested on waking, does not get up to void and sleeps 8-9 hours nightly.   Home Safety/Smoke Alarms: Feels safe in home. Smoke alarms in place.  Living environment; residence and Firearm Safety: 1-story house/ trailer, no firearms. Lives with daughter, no needs for DME, good support system Seat Belt Safety/Bike Helmet: Wears seat belt.     Objective:    Today's Vitals   06/11/17 1304  BP: (!) 156/82  Pulse: 69  Resp: 18  SpO2: 98%  Weight: 160 lb (72.6 kg)  Height: 5\' 1"  (1.549 m)   Body mass index is 30.23 kg/m.  Advanced Directives 06/11/2017 10/01/2016 09/29/2016 09/17/2016 07/07/2016 06/16/2016 01/31/2016  Does Patient Have a Medical Advance Directive? No No No No No No No  Does patient want to make changes to medical advance directive? - - - - - No - Patient declined -  Would patient like information on creating a medical advance directive? Yes (ED - Information included in AVS) No - Patient declined - No - Patient declined No - Patient declined No - Patient declined No - patient declined information    Current Medications (verified) Outpatient Encounter Medications as of 06/11/2017  Medication Sig  . Adalimumab (HUMIRA) 40 MG/0.8ML PSKT Inject into the skin.  . folic acid (FOLVITE) 1 MG tablet TAKE 1 TABLET BY MOUTH EVERY DAY  . methotrexate (RHEUMATREX) 2.5 MG tablet Take 5-7.5 mg by mouth 2 (two) times a week. Pt takes three tablets once daily on Sunday and two once daily on Monday.  . naproxen (NAPROSYN) 500 MG tablet Take 1 tablet (500 mg total) by mouth 2 (two) times daily with a meal.  . pantoprazole (PROTONIX) 40 MG tablet Take 1 tablet (40 mg total) by mouth daily.  .  predniSONE (DELTASONE) 2.5 MG tablet Take 2.5 mg by mouth daily with breakfast.  . tiotropium (SPIRIVA HANDIHALER) 18 MCG inhalation capsule INHALE CONTENTS OF CAPSULE VIA HANDIHALER EVERY DAY  . traMADol (ULTRAM-ER) 100 MG 24 hr tablet Take 1 tablet (100 mg total) by mouth daily.  Marland Kitchen gabapentin (NEURONTIN) 300 MG capsule Take 1 capsule (300 mg total) by mouth 3 (three) times daily.  . [DISCONTINUED] cholecalciferol (VITAMIN D) 1000 units tablet Take 1,000 Units by mouth daily.  . [DISCONTINUED] vitamin B-12 (CYANOCOBALAMIN) 1000 MCG tablet Take 1,000 mcg by mouth daily.   No facility-administered encounter medications on file as of 06/11/2017.     Allergies (verified) Codeine   History: Past Medical History:  Diagnosis Date  . Allergy   . Arthritis   . Asthma   . Emphysema of lung ([place])   . Neuromuscular disorder ([place])   . Osteoporosis   . Wears dentures    full upper   Past Surgical History:  Procedure Laterality Date  . APPENDECTOMY    . CATARACT EXTRACTION W/PHACO Left 06/16/2016   Procedure: CATARACT EXTRACTION PHACO AND INTRAOCULAR LENS PLACEMENT ([place])  Left;  Surgeon: Eulogio Bear, MD;  Location: [facility];  Service: Ophthalmology;  Laterality: Left;  Left eye IVA Topical  . CATARACT EXTRACTION W/PHACO Right 07/07/2016   Procedure: CATARACT EXTRACTION PHACO AND INTRAOCULAR LENS PLACEMENT ([place])  Right;  Surgeon: Eulogio Bear, MD;  Location:  [facility];  Service: Ophthalmology;  Laterality: Right;  . COLON SURGERY    . SMALL INTESTINE SURGERY     Family History  Problem Relation Age of Onset  . Arthritis Mother   . Hypertension Mother   . Arthritis Father   . Cancer Father        colon  . Hypertension Father    Social History   Socioeconomic History  . Marital status: Divorced    Spouse name: None  . Number of children: None  . Years of education: None  . Highest education level: None  Social Needs  . Financial resource strain: Not  hard at all  . Food insecurity - worry: Never true  . Food insecurity - inability: Never true  . Transportation needs - medical: No  . Transportation needs - non-medical: No  Occupational History  . None  Tobacco Use  . Smoking status: Former Smoker    Last attempt to quit: 05/01/2007    Years since quitting: 10.1  . Smokeless tobacco: Current User    Types: Snuff  Substance and Sexual Activity  . Alcohol use: No    Alcohol/week: 0.0 oz  . Drug use: No  . Sexual activity: No  Other Topics Concern  . None  Social History Narrative  . None    Tobacco Counseling Ready to quit: Not Answered Counseling given: Not Answered   Activities of Daily Living In your present state of health, do you have any difficulty performing the following activities: 06/11/2017 07/07/2016  Hearing? N N  Vision? N N  Difficulty concentrating or making decisions? N N  Walking or climbing stairs? N N  Dressing or bathing? N N  Doing errands, shopping? N -  Preparing Food and eating ? N -  Using the Toilet? N -  In the past six months, have you accidently leaked urine? N -  Do you have problems with loss of bowel control? N -  Managing your Medications? N -  Managing your Finances? N -  Housekeeping or managing your Housekeeping? N -  Some recent data might be hidden     Immunizations and Health Maintenance Immunization History  Administered Date(s) Administered  . Pneumococcal Polysaccharide-23 06/11/2017  . Tdap 11/06/2009  . Zoster 06/26/2014   Health Maintenance Due  Topic Date Due  . Hepatitis C Screening  Apr 04, 1946    Patient Care Team: Hoyt Koch, MD as PCP - General (Internal Medicine) Eulogio Bear, MD as Consulting Physician (Ophthalmology) Marlowe Sax, MD as Referring Physician (Internal Medicine)  Indicate any recent Medical Services you may have received from other than [facility] providers in the past year (date may be approximate).       Assessment:   This is a routine wellness examination for Sandra Schwartz. Physical assessment deferred to PCP.   Hearing/Vision screen Hearing Screening Comments: Able to hear conversational tones w/o difficulty. No issues reported. Passed whisper test  Vision Screening Comments: appointment yearly, Dr. Edison Pace    Dietary issues and exercise activities discussed: Current Exercise Habits: Home exercise routine, Type of exercise: walking, Time (Minutes): 30, Frequency (Times/Week): 7, Weekly Exercise (Minutes/Week): 210, Intensity: Mild, Exercise limited by: orthopedic condition(s)  Diet (meal preparation, eat out, water intake, caffeinated beverages, dairy products, fruits and vegetables): in general, a "healthy" diet  , well balanced, eats a variety of fruits and vegetables daily, limits salt, fat/cholesterol, sugar, caffeine.  Encouraged patient to increase daily water intake.   Goals    . Patient  Stated     Maintain current health status, continue walk daily, eat healthy, stay active doing house work, enjoy life and family      Depression Screen PHQ 2/9 Scores 06/11/2017 05/01/2015  PHQ - 2 Score 0 0  PHQ- 9 Score 0 -    Fall Risk Fall Risk  06/11/2017 05/01/2015  Falls in the past year? No No   Cognitive Function: MMSE - Mini Mental State Exam 06/11/2017  Not completed: (No Data)  Orientation to time 5  Orientation to Place 5  Registration 3  Attention/ Calculation 0  Recall 0  Language- name 2 objects 2  Language- repeat 1  Language- follow 3 step command 3  Language- read & follow direction 1  Write a sentence 1  Copy design 1  Total score 22        Screening Tests Health Maintenance  Topic Date Due  . Hepatitis C Screening  01-28-1946  . INFLUENZA VACCINE  02/11/2018 (Originally 12/23/2016)  . MAMMOGRAM  10/21/2018  . TETANUS/TDAP  11/07/2019  . COLONOSCOPY  11/06/2024  . DEXA SCAN  Addressed  . PNA vac Low Risk Adult  Addressed     Plan:    Continue doing brain  stimulating activities (puzzles, reading, adult coloring books, staying active) to keep memory sharp.   Continue to eat heart healthy diet (full of fruits, vegetables, whole grains, lean protein, water--limit salt, fat, and sugar intake) and increase physical activity as tolerated.  I have personally reviewed and noted the following in the patient's chart:   . Medical and social history . Use of alcohol, tobacco or illicit drugs  . Current medications and supplements . Functional ability and status . Nutritional status . Physical activity . Advanced directives . List of other physicians . Vitals . Screenings to include cognitive, depression, and falls . Referrals and appointments  In addition, I have reviewed and discussed with patient certain preventive protocols, quality metrics, and best practice recommendations. A written personalized care plan for preventive services as well as general preventive health recommendations were provided to patient.     Michiel Cowboy, RN   06/11/2017

## 2017-06-11 ENCOUNTER — Ambulatory Visit (INDEPENDENT_AMBULATORY_CARE_PROVIDER_SITE_OTHER): Payer: Medicare Other | Admitting: *Deleted

## 2017-06-11 VITALS — BP 156/82 | HR 69 | Resp 18 | Ht 61.0 in | Wt 160.0 lb

## 2017-06-11 DIAGNOSIS — Z23 Encounter for immunization: Secondary | ICD-10-CM | POA: Diagnosis not present

## 2017-06-11 DIAGNOSIS — Z0000 Encounter for general adult medical examination without abnormal findings: Secondary | ICD-10-CM

## 2017-06-11 NOTE — Patient Instructions (Addendum)
Continue doing brain stimulating activities (puzzles, reading, adult coloring books, staying active) to keep memory sharp.   Continue to eat heart healthy diet (full of fruits, vegetables, whole grains, lean protein, water--limit salt, fat, and sugar intake) and increase physical activity as tolerated.   Sandra Schwartz , Thank you for taking time to come for your Medicare Wellness Visit. I appreciate your ongoing commitment to your health goals. Please review the following plan we discussed and let me know if I can assist you in the future.   These are the goals we discussed: Goals    . Patient Stated     Maintain current health status, continue walk daily, eat healthy, stay active doing house work, enjoy life and family       This is a list of the screening recommended for you and due dates:  Health Maintenance  Topic Date Due  .  Hepatitis C: One time screening is recommended by Center for Disease Control  (CDC) for  adults born from [birth year] through 1966.   1945-10-25  . Pneumonia vaccines (2 of 2 - PPSV23) 12/07/2015  . Flu Shot  02/11/2018*  . Mammogram  10/21/2018  . Tetanus Vaccine  11/07/2019  . Colon Cancer Screening  11/06/2024  . DEXA scan (bone density measurement)  Addressed  *Topic was postponed. The date shown is not the original due date.   It is important to avoid accidents which may result in broken bones.  Here are a few ideas on how to make your home safer so you will be less likely to trip or fall.  1. Use nonskid mats or non slip strips in your shower or tub, on your bathroom floor and around sinks.  If you know that you have spilled water, wipe it up! 2. In the bathroom, it is important to have properly installed grab bars on the walls or on the edge of the tub.  Towel racks are NOT strong enough for you to hold onto or to pull on for support. 3. Stairs and hallways should have enough light.  Add lamps or night lights if you need ore light. 4. It is good to have  handrails on both sides of the stairs if possible.  Always fix broken handrails right away. 5. It is important to see the edges of steps.  Paint the edges of outdoor steps white so you can see them better.  Put colored tape on the edge of inside steps. 6. Throw-rugs are dangerous because they can slide.  Removing the rugs is the best idea, but if they must stay, add adhesive carpet tape to prevent slipping. 7. Do not keep things on stairs or in the halls.  Remove small furniture that blocks the halls as it may cause you to trip.  Keep telephone and electrical cords out of the way where you walk. 8. Always were sturdy, rubber-soled shoes for good support.  Never wear just socks, especially on the stairs.  Socks may cause you to slip or fall.  Do not wear full-length housecoats as you can easily trip on the bottom.  9. Place the things you use the most on the shelves that are the easiest to reach.  If you use a stepstool, make sure it is in good condition.  If you feel unsteady, DO NOT climb, ask for help. 10. If a health professional advises you to use a cane or walker, do not be ashamed.  These items can keep you from falling and breaking  your bones.

## 2017-06-11 NOTE — Progress Notes (Signed)
Medical screening examination/treatment/procedure(s) were performed by non-physician practitioner and as supervising physician I was immediately available for consultation/collaboration. I agree with above. Elizabeth A Crawford, MD 

## 2017-06-21 ENCOUNTER — Other Ambulatory Visit: Payer: Self-pay | Admitting: Internal Medicine

## 2017-07-01 DIAGNOSIS — M059 Rheumatoid arthritis with rheumatoid factor, unspecified: Secondary | ICD-10-CM | POA: Diagnosis not present

## 2017-07-01 DIAGNOSIS — Z79899 Other long term (current) drug therapy: Secondary | ICD-10-CM | POA: Diagnosis not present

## 2017-07-01 DIAGNOSIS — M810 Age-related osteoporosis without current pathological fracture: Secondary | ICD-10-CM | POA: Diagnosis not present

## 2017-07-27 DIAGNOSIS — M810 Age-related osteoporosis without current pathological fracture: Secondary | ICD-10-CM | POA: Diagnosis not present

## 2017-07-27 DIAGNOSIS — E559 Vitamin D deficiency, unspecified: Secondary | ICD-10-CM | POA: Diagnosis not present

## 2017-07-27 DIAGNOSIS — M059 Rheumatoid arthritis with rheumatoid factor, unspecified: Secondary | ICD-10-CM | POA: Diagnosis not present

## 2017-07-27 DIAGNOSIS — Z79899 Other long term (current) drug therapy: Secondary | ICD-10-CM | POA: Diagnosis not present

## 2017-07-27 DIAGNOSIS — M2041 Other hammer toe(s) (acquired), right foot: Secondary | ICD-10-CM | POA: Diagnosis not present

## 2017-08-02 ENCOUNTER — Telehealth: Payer: Self-pay | Admitting: Internal Medicine

## 2017-08-02 MED ORDER — TIOTROPIUM BROMIDE MONOHYDRATE 18 MCG IN CAPS: ORAL_CAPSULE | RESPIRATORY_TRACT | 0 refills | Status: DC

## 2017-08-02 NOTE — Telephone Encounter (Signed)
Copied from [place] [phone]. Topic: Quick Communication - Rx Refill/Question >> Aug 02, 2017 12:08 PM Cecelia Byars, NT wrote: Medication:  tiotropium (SPIRIVA HANDIHALER) 18 MCG inhalation capsule  Has the patient contacted their pharmacy? yes  (Agent: If no, request that the patient contact the pharmacy for the refill. [id number] [place] [place] [facility] [address] [phone] (Phone) [phone] (Fax Agent: Please be advised that RX refills may take up to 3 business days. We ask that you follow-up with your pharmacy.

## 2017-08-12 ENCOUNTER — Other Ambulatory Visit: Payer: Self-pay | Admitting: Internal Medicine

## 2017-08-24 ENCOUNTER — Other Ambulatory Visit: Payer: Self-pay | Admitting: Internal Medicine

## 2017-09-27 ENCOUNTER — Other Ambulatory Visit: Payer: Self-pay | Admitting: Internal Medicine

## 2017-09-27 DIAGNOSIS — Z1231 Encounter for screening mammogram for malignant neoplasm of breast: Secondary | ICD-10-CM

## 2017-09-28 ENCOUNTER — Other Ambulatory Visit: Payer: Self-pay | Admitting: Internal Medicine

## 2017-10-08 ENCOUNTER — Emergency Department (HOSPITAL_COMMUNITY): Payer: Medicare Other

## 2017-10-08 ENCOUNTER — Emergency Department (HOSPITAL_COMMUNITY)
Admission: EM | Admit: 2017-10-08 | Discharge: 2017-10-09 | Disposition: A | Discharge status: Home / Self Care | Payer: Medicare Other | Attending: Emergency Medicine | Admitting: Emergency Medicine

## 2017-10-08 ENCOUNTER — Encounter (HOSPITAL_COMMUNITY): Payer: Self-pay | Admitting: Emergency Medicine

## 2017-10-08 DIAGNOSIS — R079 Chest pain, unspecified: Secondary | ICD-10-CM | POA: Diagnosis not present

## 2017-10-08 DIAGNOSIS — R0602 Shortness of breath: Secondary | ICD-10-CM | POA: Insufficient documentation

## 2017-10-08 DIAGNOSIS — Z5321 Procedure and treatment not carried out due to patient leaving prior to being seen by health care provider: Secondary | ICD-10-CM | POA: Insufficient documentation

## 2017-10-08 DIAGNOSIS — R0789 Other chest pain: Secondary | ICD-10-CM | POA: Diagnosis not present

## 2017-10-08 LAB — CBC
HCT: 37.7 % (ref 36.0–46.0)
Hemoglobin: 12.1 g/dL (ref 12.0–15.0)
MCH: 29.4 pg (ref 26.0–34.0)
MCHC: 32.1 g/dL (ref 30.0–36.0)
MCV: 91.5 fL (ref 78.0–100.0)
Platelets: 206 10*3/uL (ref 150–400)
RBC: 4.12 MIL/uL (ref 3.87–5.11)
RDW: 15.6 % — AB (ref 11.5–15.5)
WBC: 5.2 10*3/uL (ref 4.0–10.5)

## 2017-10-08 LAB — BASIC METABOLIC PANEL
Anion gap: 9 (ref 5–15)
BUN: 11 mg/dL (ref 6–20)
CALCIUM: 9 mg/dL (ref 8.9–10.3)
CO2: 23 mmol/L (ref 22–32)
CREATININE: 1.09 mg/dL — AB (ref 0.44–1.00)
Chloride: 105 mmol/L (ref 101–111)
GFR calc Af Amer: 57 mL/min — ABNORMAL LOW (ref >60)
GFR, EST NON AFRICAN AMERICAN: 49 mL/min — AB (ref >60)
Glucose, Bld: 196 mg/dL — ABNORMAL HIGH (ref 65–99)
Potassium: 3.6 mmol/L (ref 3.5–5.1)
SODIUM: 137 mmol/L (ref 135–145)

## 2017-10-08 LAB — I-STAT TROPONIN, ED: Troponin i, poc: 0 ng/mL (ref 0.00–0.08)

## 2017-10-08 LAB — BRAIN NATRIURETIC PEPTIDE: B Natriuretic Peptide: 59.6 pg/mL (ref 0.0–100.0)

## 2017-10-08 MED ORDER — PREDNISONE 20 MG PO TABS: 60.0000 mg | ORAL_TABLET | Freq: Once | ORAL | Status: DC

## 2017-10-08 MED ORDER — ALBUTEROL SULFATE (2.5 MG/3ML) 0.083% IN NEBU
5.0000 mg | INHALATION_SOLUTION | Freq: Once | RESPIRATORY_TRACT | Status: AC
  Administered 2017-10-08: 5 mg via RESPIRATORY_TRACT
  Filled 2017-10-08: qty 6

## 2017-10-08 NOTE — ED Provider Notes (Signed)
Patient placed in Quick Look pathway, seen and evaluated   Chief Complaint: shortness of breath  HPI:  72 y.o. female with a history of COPD and cooperative department today for shortness of breath.  Patient states that she woke this morning with shortness of breath, "congestion in her chest", chest tightness.  She has been using her Spiriva at home without any relief.  She denies any fever, chills, chest pain, change in cough or hemoptysis.  No lower leg swelling.  ROS:  Positive ROS: (+) Shortness of breath and chest tightness Negative ROS: (-) Change in cough, fever, chills, chest pain, hemoptysis, or leg swelling  Physical Exam:   Gen: No distress  Neuro: Awake and Alert  Skin: Warm  Focused Exam: Heart RRR, nml S1,S2, no m/r/g; Lungs global expiratory wheezes, decreased breath sounds of the lower lobes bilaterally.  Patient is satting at 97% on room air; Abd soft, NT, no rebound or guarding; Ext 2+ pedal pulses bilaterally, no edema.  BP (!) 164/75 (BP Location: Right Arm)   Pulse 77   Temp 98.1 F (36.7 C) (Oral)   Resp 20   Ht 5\' 1"  (1.549 m)   Wt 72.6 kg (160 lb)   SpO2 97%   BMI 30.23 kg/m   Plan: Based on initial evaluation, labs AREindicated and radiology studies ARE indicated.  Patient counseled on process, plan, and necessity for staying for completing the evaluation."  Initiation of care has begun. The patient has been counseled on the process, plan, and necessity for staying for the completion/evaluation, and the remainder of the medical screening examination    Lorelle Gibbs 10/08/17 [place], Sam, MD 10/09/17 [phone]

## 2017-10-08 NOTE — ED Triage Notes (Signed)
Reports having increased SOB since yesterday.  Hx of COPD.  Reports having nebulizer machine at home but no medication for it.

## 2017-10-11 ENCOUNTER — Encounter: Payer: Self-pay | Admitting: Internal Medicine

## 2017-10-11 ENCOUNTER — Ambulatory Visit (INDEPENDENT_AMBULATORY_CARE_PROVIDER_SITE_OTHER): Payer: Medicare Other | Admitting: Internal Medicine

## 2017-10-11 DIAGNOSIS — J181 Lobar pneumonia, unspecified organism: Secondary | ICD-10-CM

## 2017-10-11 DIAGNOSIS — J189 Pneumonia, unspecified organism: Secondary | ICD-10-CM

## 2017-10-11 DIAGNOSIS — M069 Rheumatoid arthritis, unspecified: Secondary | ICD-10-CM | POA: Diagnosis not present

## 2017-10-11 MED ORDER — ALBUTEROL SULFATE (2.5 MG/3ML) 0.083% IN NEBU: 2.5000 mg | INHALATION_SOLUTION | Freq: Four times a day (QID) | RESPIRATORY_TRACT | 1 refills | Status: AC | PRN

## 2017-10-11 MED ORDER — DOXYCYCLINE HYCLATE 100 MG PO TABS: 100.0000 mg | ORAL_TABLET | Freq: Two times a day (BID) | ORAL | 0 refills | Status: DC

## 2017-10-11 MED ORDER — TRAMADOL HCL ER 100 MG PO TB24: 100.0000 mg | ORAL_TABLET | Freq: Every day | ORAL | 0 refills | Status: DC

## 2017-10-11 NOTE — Patient Instructions (Addendum)
We have sent in the doxycycline for the pneumonia. Take 1 pill twice a day for 1 week. You should get a repeat chest x-ray in 4-5 weeks.   We have sent in the albuterol medicine for the nebulizer.   We have given you the prescription for nebulizer supplies.   We have given you a sample of the anoro which is the medicine we talked about. Take 1 puff daily. This will replace the spiriva. Let us know how you do with it.   Come back within the next month for a well visit as we have refilled the tramadol today for 1 month but cannot refill it without a visit after that.

## 2017-10-11 NOTE — Progress Notes (Signed)
   Subjective:    Patient ID: Sandra Schwartz, female    DOB: 1946/01/18, 72 y.o.   MRN: [id number]  HPI The patient is a 72 YO female coming in for ER follow up (SOB and given albuterol treatment and had labs and CXR but left AMA prior to further evaluation). The albuterol treatment did help her SOB. She is still having cough and SOB with activity. She denies fevers or chills. Some pain in the left side. Denies allergy symptoms or drainage. No headaches or sinus pressure. No ear pain or discharge.  Needs refill of tramadol for her arthritis pain today. She is out. Usually takes sometimes but not daily. Has pain from chronic rheumatoid arthritis which is treated by rheumatology. She denies overuse. Pain is worse with this cough and she is suffering.   Review of Systems  Constitutional: Positive for activity change and appetite change. Negative for chills, fatigue, fever and unexpected weight change.  HENT: Negative for congestion, ear discharge, ear pain, postnasal drip, rhinorrhea, sinus pressure, sinus pain, sneezing, sore throat, tinnitus, trouble swallowing and voice change.   Eyes: Negative.   Respiratory: Positive for cough and shortness of breath. Negative for chest tightness and wheezing.   Cardiovascular: Negative.   Gastrointestinal: Negative.   Musculoskeletal: Positive for myalgias.  Neurological: Negative.       Objective:   Physical Exam  Constitutional: She is oriented to person, place, and time. She appears well-developed and well-nourished.  HENT:  Head: Normocephalic and atraumatic.  Eyes: EOM are normal.  Neck: Normal range of motion.  Cardiovascular: Normal rate and regular rhythm.  Pulmonary/Chest: Effort normal and breath sounds normal. No respiratory distress. She has no wheezes. She has no rales.  Abdominal: Soft.  Musculoskeletal: She exhibits no edema.  Neurological: She is alert and oriented to person, place, and time. Coordination normal.  Skin: Skin is warm and  dry.   Vitals:   10/11/17 1556  BP: 130/86  Pulse: 69  Temp: 98.3 F (36.8 C)  TempSrc: Oral  SpO2: 97%  Weight: 159 lb (72.1 kg)  Height: 5\' 1"  (1.549 m)      Assessment & Plan:

## 2017-10-12 ENCOUNTER — Other Ambulatory Visit: Payer: Self-pay | Admitting: Internal Medicine

## 2017-10-12 DIAGNOSIS — J189 Pneumonia, unspecified organism: Secondary | ICD-10-CM | POA: Insufficient documentation

## 2017-10-12 NOTE — Assessment & Plan Note (Signed)
Tramadol refilled for 1 month and reminded that she needs routine follow up for more refills.

## 2017-10-12 NOTE — Assessment & Plan Note (Signed)
Needs CXR at follow up in 1 month. Rx for doxycycline today for 1 week. Not treated as she left ER prior to results being back. Rx for albuterol nebulizer today and written rx for nebulizer supplies.

## 2017-10-20 ENCOUNTER — Other Ambulatory Visit: Payer: Self-pay | Admitting: Internal Medicine

## 2017-10-22 ENCOUNTER — Ambulatory Visit
Admission: RE | Admit: 2017-10-22 | Discharge: 2017-10-22 | Disposition: A | Discharge status: Home / Self Care | Payer: Medicare Other | Source: Ambulatory Visit | Attending: Internal Medicine | Admitting: Internal Medicine

## 2017-10-22 DIAGNOSIS — Z1231 Encounter for screening mammogram for malignant neoplasm of breast: Secondary | ICD-10-CM | POA: Diagnosis not present

## 2017-10-25 DIAGNOSIS — M059 Rheumatoid arthritis with rheumatoid factor, unspecified: Secondary | ICD-10-CM | POA: Diagnosis not present

## 2017-10-25 DIAGNOSIS — Z79899 Other long term (current) drug therapy: Secondary | ICD-10-CM | POA: Diagnosis not present

## 2017-10-25 DIAGNOSIS — E559 Vitamin D deficiency, unspecified: Secondary | ICD-10-CM | POA: Diagnosis not present

## 2017-10-27 ENCOUNTER — Telehealth: Payer: Self-pay | Admitting: Internal Medicine

## 2017-10-27 NOTE — Telephone Encounter (Signed)
Routing to laura, do you have any suggestions in the absence of dr crawford that I could share with patient, please advise, I will call patient back, thanks

## 2017-10-27 NOTE — Telephone Encounter (Signed)
Copied from [place] [phone]. Topic: Inquiry >> Oct 27, 2017  8:42 AM Margot Ables wrote: Reason for CRM: Pt daughter, [name], states that pt Vit D was very low last week at [facility] (at [facility]. Meda [facility]). She states it was at 5. [name] notes that pt takes folic acid and has to be careful of vitamins. [name] talked to pharmacist and was advised to contact doctor. Pt has taken the pill form before and OTC softgel of Vit D. Pt had a reaction to both where her feet and legs blistered. [name] is asking what they can do to help pts Vit D. Please advise.

## 2017-11-01 NOTE — Telephone Encounter (Signed)
Can try getting 15 mins/ day on her forearms; can try  Fatty fish, like tuna, mackerel, and salmon. Foods fortified with vitamin D, like some dairy products, orange juice, soy milk, and cereals. Beef liver. Cheese. Egg yolks.

## 2017-11-02 NOTE — Telephone Encounter (Signed)
Left message with lisa's phone advising of laura's note/instructions, can call back if any further questions

## 2017-11-08 ENCOUNTER — Other Ambulatory Visit (INDEPENDENT_AMBULATORY_CARE_PROVIDER_SITE_OTHER): Payer: Medicare Other

## 2017-11-08 ENCOUNTER — Ambulatory Visit (INDEPENDENT_AMBULATORY_CARE_PROVIDER_SITE_OTHER): Payer: Medicare Other | Admitting: Internal Medicine

## 2017-11-08 ENCOUNTER — Encounter: Payer: Self-pay | Admitting: Internal Medicine

## 2017-11-08 VITALS — BP 90/60 | HR 70 | Temp 98.2°F | Ht 61.0 in | Wt 160.0 lb

## 2017-11-08 DIAGNOSIS — E785 Hyperlipidemia, unspecified: Secondary | ICD-10-CM | POA: Diagnosis not present

## 2017-11-08 DIAGNOSIS — I10 Essential (primary) hypertension: Secondary | ICD-10-CM

## 2017-11-08 DIAGNOSIS — Z0000 Encounter for general adult medical examination without abnormal findings: Secondary | ICD-10-CM | POA: Diagnosis not present

## 2017-11-08 DIAGNOSIS — J42 Unspecified chronic bronchitis: Secondary | ICD-10-CM

## 2017-11-08 DIAGNOSIS — M069 Rheumatoid arthritis, unspecified: Secondary | ICD-10-CM

## 2017-11-08 LAB — COMPREHENSIVE METABOLIC PANEL
ALBUMIN: 4.1 g/dL (ref 3.5–5.2)
ALK PHOS: 76 U/L (ref 39–117)
ALT: 32 U/L (ref 0–35)
AST: 22 U/L (ref 0–37)
BUN: 26 mg/dL — AB (ref 6–23)
CALCIUM: 9.3 mg/dL (ref 8.4–10.5)
CHLORIDE: 104 meq/L (ref 96–112)
CO2: 26 mEq/L (ref 19–32)
Creatinine, Ser: 0.92 mg/dL (ref 0.40–1.20)
GFR: 63.59 mL/min (ref >60.00)
Glucose, Bld: 115 mg/dL — ABNORMAL HIGH (ref 70–99)
Potassium: 3.8 mEq/L (ref 3.5–5.1)
SODIUM: 138 meq/L (ref 135–145)
TOTAL PROTEIN: 6.5 g/dL (ref 6.0–8.3)
Total Bilirubin: 0.6 mg/dL (ref 0.2–1.2)

## 2017-11-08 LAB — LIPID PANEL
CHOLESTEROL: 253 mg/dL — AB (ref 0–200)
HDL: 65.2 mg/dL (ref >39.00)
LDL Cholesterol: 152 mg/dL — ABNORMAL HIGH (ref 0–99)
NONHDL: 188.07
Total CHOL/HDL Ratio: 4
Triglycerides: 182 mg/dL — ABNORMAL HIGH (ref 0.0–149.0)
VLDL: 36.4 mg/dL (ref 0.0–40.0)

## 2017-11-08 LAB — HEMOGLOBIN A1C: HEMOGLOBIN A1C: 5.4 % (ref 4.6–6.5)

## 2017-11-08 MED ORDER — TRAMADOL HCL ER 100 MG PO TB24: 100.0000 mg | ORAL_TABLET | Freq: Every day | ORAL | 3 refills | Status: AC

## 2017-11-08 MED ORDER — UMECLIDINIUM-VILANTEROL 62.5-25 MCG/INH IN AEPB: 1.0000 | INHALATION_SPRAY | Freq: Every day | RESPIRATORY_TRACT | 3 refills | Status: DC

## 2017-11-08 NOTE — Patient Instructions (Addendum)
We have sent in the anoro to take 1 puff daily to help the breathing.   Health Maintenance, Female Adopting a healthy lifestyle and getting preventive care can go a long way to promote health and wellness. Talk with your health care provider about what schedule of regular examinations is right for you. This is a good chance for you to check in with your provider about disease prevention and staying healthy. In between checkups, there are plenty of things you can do on your own. Experts have done a lot of research about which lifestyle changes and preventive measures are most likely to keep you healthy. Ask your health care provider for more information. Weight and diet Eat a healthy diet  Be sure to include plenty of vegetables, fruits, low-fat dairy products, and lean protein.  Do not eat a lot of foods high in solid fats, added sugars, or salt.  Get regular exercise. This is one of the most important things you can do for your health. ? Most adults should exercise for at least 150 minutes each week. The exercise should increase your heart rate and make you sweat (moderate-intensity exercise). ? Most adults should also do strengthening exercises at least twice a week. This is in addition to the moderate-intensity exercise.  Maintain a healthy weight  Body mass index (BMI) is a measurement that can be used to identify possible weight problems. It estimates body fat based on height and weight. Your health care provider can help determine your BMI and help you achieve or maintain a healthy weight.  For females 47 years of age and older: ? A BMI below 18.5 is considered underweight. ? A BMI of 18.5 to 24.9 is normal. ? A BMI of 25 to 29.9 is considered overweight. ? A BMI of 30 and above is considered obese.  Watch levels of cholesterol and blood lipids  You should start having your blood tested for lipids and cholesterol at 73 years of age, then have this test every 5 years.  You may need  to have your cholesterol levels checked more often if: ? Your lipid or cholesterol levels are high. ? You are older than 73 years of age. ? You are at high risk for heart disease.  Cancer screening Lung Cancer  Lung cancer screening is recommended for adults 37-65 years old who are at high risk for lung cancer because of a history of smoking.  A yearly low-dose CT scan of the lungs is recommended for people who: ? Currently smoke. ? Have quit within the past 15 years. ? Have at least a 30-pack-year history of smoking. A pack year is smoking an average of one pack of cigarettes a day for 1 year.  Yearly screening should continue until it has been 15 years since you quit.  Yearly screening should stop if you develop a health problem that would prevent you from having lung cancer treatment.  Breast Cancer  Practice breast self-awareness. This means understanding how your breasts normally appear and feel.  It also means doing regular breast self-exams. Let your health care provider know about any changes, no matter how small.  If you are in your 20s or 30s, you should have a clinical breast exam (CBE) by a health care provider every 1-3 years as part of a regular health exam.  If you are 2 or older, have a CBE every year. Also consider having a breast X-ray (mammogram) every year.  If you have a family history of breast  cancer, talk to your health care provider about genetic screening.  If you are at high risk for breast cancer, talk to your health care provider about having an MRI and a mammogram every year.  Breast cancer gene (BRCA) assessment is recommended for women who have family members with BRCA-related cancers. BRCA-related cancers include: ? Breast. ? Ovarian. ? Tubal. ? Peritoneal cancers.  Results of the assessment will determine the need for genetic counseling and BRCA1 and BRCA2 testing.  Cervical Cancer Your health care provider may recommend that you be  screened regularly for cancer of the pelvic organs (ovaries, uterus, and vagina). This screening involves a pelvic examination, including checking for microscopic changes to the surface of your cervix (Pap test). You may be encouraged to have this screening done every 3 years, beginning at age 35.  For women ages 30-65, health care providers may recommend pelvic exams and Pap testing every 3 years, or they may recommend the Pap and pelvic exam, combined with testing for human papilloma virus (HPV), every 5 years. Some types of HPV increase your risk of cervical cancer. Testing for HPV may also be done on women of any age with unclear Pap test results.  Other health care providers may not recommend any screening for nonpregnant women who are considered low risk for pelvic cancer and who do not have symptoms. Ask your health care provider if a screening pelvic exam is right for you.  If you have had past treatment for cervical cancer or a condition that could lead to cancer, you need Pap tests and screening for cancer for at least 20 years after your treatment. If Pap tests have been discontinued, your risk factors (such as having a new sexual partner) need to be reassessed to determine if screening should resume. Some women have medical problems that increase the chance of getting cervical cancer. In these cases, your health care provider may recommend more frequent screening and Pap tests.  Colorectal Cancer  This type of cancer can be detected and often prevented.  Routine colorectal cancer screening usually begins at 73 years of age and continues through 73 years of age.  Your health care provider may recommend screening at an earlier age if you have risk factors for colon cancer.  Your health care provider may also recommend using home test kits to check for hidden blood in the stool.  A small camera at the end of a tube can be used to examine your colon directly (sigmoidoscopy or colonoscopy).  This is done to check for the earliest forms of colorectal cancer.  Routine screening usually begins at age 45.  Direct examination of the colon should be repeated every 5-10 years through 73 years of age. However, you may need to be screened more often if early forms of precancerous polyps or small growths are found.  Skin Cancer  Check your skin from head to toe regularly.  Tell your health care provider about any new moles or changes in moles, especially if there is a change in a mole's shape or color.  Also tell your health care provider if you have a mole that is larger than the size of a pencil eraser.  Always use sunscreen. Apply sunscreen liberally and repeatedly throughout the day.  Protect yourself by wearing long sleeves, pants, a wide-brimmed hat, and sunglasses whenever you are outside.  Heart disease, diabetes, and high blood pressure  High blood pressure causes heart disease and increases the risk of stroke. High blood pressure  is more likely to develop in: ? People who have blood pressure in the high end of the normal range (130-139/85-89 mm Hg). ? People who are overweight or obese. ? People who are African American.  If you are 71-68 years of age, have your blood pressure checked every 3-5 years. If you are 27 years of age or older, have your blood pressure checked every year. You should have your blood pressure measured twice-once when you are at a hospital or clinic, and once when you are not at a hospital or clinic. Record the average of the two measurements. To check your blood pressure when you are not at a hospital or clinic, you can use: ? An automated blood pressure machine at a pharmacy. ? A home blood pressure monitor.  If you are between 3 years and 42 years old, ask your health care provider if you should take aspirin to prevent strokes.  Have regular diabetes screenings. This involves taking a blood sample to check your fasting blood sugar level. ? If  you are at a normal weight and have a low risk for diabetes, have this test once every three years after 73 years of age. ? If you are overweight and have a high risk for diabetes, consider being tested at a younger age or more often. Preventing infection Hepatitis B  If you have a higher risk for hepatitis B, you should be screened for this virus. You are considered at high risk for hepatitis B if: ? You were born in a country where hepatitis B is common. Ask your health care provider which countries are considered high risk. ? Your parents were born in a high-risk country, and you have not been immunized against hepatitis B (hepatitis B vaccine). ? You have HIV or AIDS. ? You use needles to inject street drugs. ? You live with someone who has hepatitis B. ? You have had sex with someone who has hepatitis B. ? You get hemodialysis treatment. ? You take certain medicines for conditions, including cancer, organ transplantation, and autoimmune conditions.  Hepatitis C  Blood testing is recommended for: ? Everyone born from 30 through 1965. ? Anyone with known risk factors for hepatitis C.  Sexually transmitted infections (STIs)  You should be screened for sexually transmitted infections (STIs) including gonorrhea and chlamydia if: ? You are sexually active and are younger than 73 years of age. ? You are older than 73 years of age and your health care provider tells you that you are at risk for this type of infection. ? Your sexual activity has changed since you were last screened and you are at an increased risk for chlamydia or gonorrhea. Ask your health care provider if you are at risk.  If you do not have HIV, but are at risk, it may be recommended that you take a prescription medicine daily to prevent HIV infection. This is called pre-exposure prophylaxis (PrEP). You are considered at risk if: ? You are sexually active and do not regularly use condoms or know the HIV status of your  partner(s). ? You take drugs by injection. ? You are sexually active with a partner who has HIV.  Talk with your health care provider about whether you are at high risk of being infected with HIV. If you choose to begin PrEP, you should first be tested for HIV. You should then be tested every 3 months for as long as you are taking PrEP. Pregnancy  If you are premenopausal and  you may become pregnant, ask your health care provider about preconception counseling.  If you may become pregnant, take 400 to 800 micrograms (mcg) of folic acid every day.  If you want to prevent pregnancy, talk to your health care provider about birth control (contraception). Osteoporosis and menopause  Osteoporosis is a disease in which the bones lose minerals and strength with aging. This can result in serious bone fractures. Your risk for osteoporosis can be identified using a bone density scan.  If you are 59 years of age or older, or if you are at risk for osteoporosis and fractures, ask your health care provider if you should be screened.  Ask your health care provider whether you should take a calcium or vitamin D supplement to lower your risk for osteoporosis.  Menopause may have certain physical symptoms and risks.  Hormone replacement therapy may reduce some of these symptoms and risks. Talk to your health care provider about whether hormone replacement therapy is right for you. Follow these instructions at home:  Schedule regular health, dental, and eye exams.  Stay current with your immunizations.  Do not use any tobacco products including cigarettes, chewing tobacco, or electronic cigarettes.  If you are pregnant, do not drink alcohol.  If you are breastfeeding, limit how much and how often you drink alcohol.  Limit alcohol intake to no more than 1 drink per day for nonpregnant women. One drink equals 12 ounces of beer, 5 ounces of wine, or 1 ounces of hard liquor.  Do not use street  drugs.  Do not share needles.  Ask your health care provider for help if you need support or information about quitting drugs.  Tell your health care provider if you often feel depressed.  Tell your health care provider if you have ever been abused or do not feel safe at home. This information is not intended to replace advice given to you by your health care provider. Make sure you discuss any questions you have with your health care provider. Document Released: 11/24/2010 Document Revised: 10/17/2015 Document Reviewed: 02/12/2015 Elsevier Interactive Patient Education  [employer].

## 2017-11-08 NOTE — Progress Notes (Signed)
   Subjective:    Patient ID: Sandra Schwartz, female    DOB: 09-Apr-1946, 72 y.o.   MRN: [id number]  HPI The patient is a 72 YO female coming in for physical. No new concerns. Not using tramadol daily, still has some left from last refill.   PMH, [facility], social history reviewed and updated.   Review of Systems  Constitutional: Negative.   HENT: Negative.   Eyes: Negative.   Respiratory: Negative for cough, chest tightness and shortness of breath.   Cardiovascular: Negative for chest pain, palpitations and leg swelling.  Gastrointestinal: Negative for abdominal distention, abdominal pain, constipation, diarrhea, nausea and vomiting.  Musculoskeletal: Positive for arthralgias, joint swelling and myalgias.  Skin: Negative.   Neurological: Negative.   Psychiatric/Behavioral: Negative.       Objective:   Physical Exam  Constitutional: She is oriented to person, place, and time. She appears well-developed and well-nourished.  HENT:  Head: Normocephalic and atraumatic.  Eyes: EOM are normal.  Neck: Normal range of motion.  Cardiovascular: Normal rate and regular rhythm.  Pulmonary/Chest: Effort normal and breath sounds normal. No respiratory distress. She has no wheezes. She has no rales.  Abdominal: Soft. Bowel sounds are normal. She exhibits no distension. There is no tenderness. There is no rebound.  Musculoskeletal: She exhibits tenderness. She exhibits no edema.  Neurological: She is alert and oriented to person, place, and time. Coordination normal.  Skin: Skin is warm and dry.  Psychiatric: She has a normal mood and affect.   Vitals:   11/08/17 1600  BP: 90/60  Pulse: 70  Temp: 98.2 F (36.8 C)  TempSrc: Oral  SpO2: 96%  Weight: 160 lb (72.6 kg)  Height: 5\' 1"  (1.549 m)      Assessment & Plan:

## 2017-11-09 ENCOUNTER — Inpatient Hospital Stay (HOSPITAL_COMMUNITY)
Admission: EM | Admit: 2017-11-09 | Discharge: 2017-11-13 | DRG: 419 | Disposition: A | Discharge status: Home / Self Care | Payer: Medicare Other | Attending: Family Medicine | Admitting: Family Medicine

## 2017-11-09 ENCOUNTER — Inpatient Hospital Stay (HOSPITAL_COMMUNITY): Payer: Medicare Other

## 2017-11-09 ENCOUNTER — Ambulatory Visit: Payer: Self-pay

## 2017-11-09 ENCOUNTER — Encounter (HOSPITAL_COMMUNITY): Payer: Self-pay | Admitting: Emergency Medicine

## 2017-11-09 ENCOUNTER — Emergency Department (HOSPITAL_COMMUNITY): Payer: Medicare Other

## 2017-11-09 DIAGNOSIS — K219 Gastro-esophageal reflux disease without esophagitis: Secondary | ICD-10-CM | POA: Diagnosis present

## 2017-11-09 DIAGNOSIS — E876 Hypokalemia: Secondary | ICD-10-CM | POA: Diagnosis present

## 2017-11-09 DIAGNOSIS — Z0000 Encounter for general adult medical examination without abnormal findings: Secondary | ICD-10-CM | POA: Insufficient documentation

## 2017-11-09 DIAGNOSIS — M069 Rheumatoid arthritis, unspecified: Secondary | ICD-10-CM | POA: Diagnosis present

## 2017-11-09 DIAGNOSIS — R932 Abnormal findings on diagnostic imaging of liver and biliary tract: Secondary | ICD-10-CM | POA: Diagnosis not present

## 2017-11-09 DIAGNOSIS — M810 Age-related osteoporosis without current pathological fracture: Secondary | ICD-10-CM | POA: Diagnosis present

## 2017-11-09 DIAGNOSIS — I10 Essential (primary) hypertension: Secondary | ICD-10-CM | POA: Diagnosis not present

## 2017-11-09 DIAGNOSIS — R1013 Epigastric pain: Secondary | ICD-10-CM | POA: Diagnosis not present

## 2017-11-09 DIAGNOSIS — J449 Chronic obstructive pulmonary disease, unspecified: Secondary | ICD-10-CM | POA: Diagnosis not present

## 2017-11-09 DIAGNOSIS — Z86010 Personal history of colonic polyps: Secondary | ICD-10-CM

## 2017-11-09 DIAGNOSIS — K8012 Calculus of gallbladder with acute and chronic cholecystitis without obstruction: Secondary | ICD-10-CM | POA: Diagnosis not present

## 2017-11-09 DIAGNOSIS — Z79899 Other long term (current) drug therapy: Secondary | ICD-10-CM

## 2017-11-09 DIAGNOSIS — Z9049 Acquired absence of other specified parts of digestive tract: Secondary | ICD-10-CM | POA: Diagnosis not present

## 2017-11-09 DIAGNOSIS — K7689 Other specified diseases of liver: Secondary | ICD-10-CM | POA: Diagnosis present

## 2017-11-09 DIAGNOSIS — R1011 Right upper quadrant pain: Secondary | ICD-10-CM

## 2017-11-09 DIAGNOSIS — D509 Iron deficiency anemia, unspecified: Secondary | ICD-10-CM | POA: Diagnosis present

## 2017-11-09 DIAGNOSIS — K8050 Calculus of bile duct without cholangitis or cholecystitis without obstruction: Secondary | ICD-10-CM | POA: Diagnosis not present

## 2017-11-09 DIAGNOSIS — M5489 Other dorsalgia: Secondary | ICD-10-CM | POA: Diagnosis not present

## 2017-11-09 DIAGNOSIS — K760 Fatty (change of) liver, not elsewhere classified: Secondary | ICD-10-CM | POA: Diagnosis not present

## 2017-11-09 DIAGNOSIS — R0602 Shortness of breath: Secondary | ICD-10-CM | POA: Diagnosis not present

## 2017-11-09 DIAGNOSIS — M545 Low back pain: Secondary | ICD-10-CM | POA: Diagnosis not present

## 2017-11-09 DIAGNOSIS — K8062 Calculus of gallbladder and bile duct with acute cholecystitis without obstruction: Secondary | ICD-10-CM | POA: Diagnosis not present

## 2017-11-09 DIAGNOSIS — E785 Hyperlipidemia, unspecified: Secondary | ICD-10-CM | POA: Diagnosis present

## 2017-11-09 DIAGNOSIS — E278 Other specified disorders of adrenal gland: Secondary | ICD-10-CM | POA: Diagnosis not present

## 2017-11-09 DIAGNOSIS — D696 Thrombocytopenia, unspecified: Secondary | ICD-10-CM | POA: Diagnosis present

## 2017-11-09 DIAGNOSIS — R7401 Elevation of levels of liver transaminase levels: Secondary | ICD-10-CM | POA: Diagnosis present

## 2017-11-09 DIAGNOSIS — R740 Nonspecific elevation of levels of transaminase and lactic acid dehydrogenase [LDH]: Secondary | ICD-10-CM

## 2017-11-09 DIAGNOSIS — R110 Nausea: Secondary | ICD-10-CM | POA: Diagnosis not present

## 2017-11-09 DIAGNOSIS — R945 Abnormal results of liver function studies: Secondary | ICD-10-CM

## 2017-11-09 DIAGNOSIS — Z7951 Long term (current) use of inhaled steroids: Secondary | ICD-10-CM | POA: Diagnosis not present

## 2017-11-09 DIAGNOSIS — K8051 Calculus of bile duct without cholangitis or cholecystitis with obstruction: Secondary | ICD-10-CM | POA: Diagnosis not present

## 2017-11-09 DIAGNOSIS — K8066 Calculus of gallbladder and bile duct with acute and chronic cholecystitis without obstruction: Secondary | ICD-10-CM | POA: Diagnosis not present

## 2017-11-09 DIAGNOSIS — J439 Emphysema, unspecified: Secondary | ICD-10-CM | POA: Diagnosis present

## 2017-11-09 DIAGNOSIS — Z720 Tobacco use: Secondary | ICD-10-CM

## 2017-11-09 DIAGNOSIS — K8020 Calculus of gallbladder without cholecystitis without obstruction: Secondary | ICD-10-CM | POA: Diagnosis not present

## 2017-11-09 DIAGNOSIS — R079 Chest pain, unspecified: Secondary | ICD-10-CM | POA: Diagnosis not present

## 2017-11-09 HISTORY — DX: Gastro-esophageal reflux disease without esophagitis: K21.9

## 2017-11-09 HISTORY — DX: Unspecified chronic bronchitis: J42

## 2017-11-09 HISTORY — DX: Other seasonal allergic rhinitis: J30.2

## 2017-11-09 HISTORY — DX: Pneumonia, unspecified organism: J18.9

## 2017-11-09 HISTORY — DX: Calculus of bile duct without cholangitis or cholecystitis without obstruction: K80.50

## 2017-11-09 LAB — COMPREHENSIVE METABOLIC PANEL
ALBUMIN: 3.9 g/dL (ref 3.5–5.0)
ALK PHOS: 109 U/L (ref 38–126)
ALT: 177 U/L — AB (ref 14–54)
AST: 222 U/L — AB (ref 15–41)
Anion gap: 9 (ref 5–15)
BILIRUBIN TOTAL: 3.1 mg/dL — AB (ref 0.3–1.2)
BUN: 20 mg/dL (ref 6–20)
CALCIUM: 9.7 mg/dL (ref 8.9–10.3)
CO2: 26 mmol/L (ref 22–32)
CREATININE: 1.01 mg/dL — AB (ref 0.44–1.00)
Chloride: 107 mmol/L (ref 101–111)
GFR calc Af Amer: >60 mL/min (ref >60)
GFR calc non Af Amer: 54 mL/min — ABNORMAL LOW (ref >60)
GLUCOSE: 177 mg/dL — AB (ref 65–99)
Potassium: 4 mmol/L (ref 3.5–5.1)
Sodium: 142 mmol/L (ref 135–145)
TOTAL PROTEIN: 7 g/dL (ref 6.5–8.1)

## 2017-11-09 LAB — URINALYSIS, ROUTINE W REFLEX MICROSCOPIC
Glucose, UA: NEGATIVE mg/dL
Hgb urine dipstick: NEGATIVE
KETONES UR: NEGATIVE mg/dL
LEUKOCYTES UA: NEGATIVE
NITRITE: NEGATIVE
PH: 6 (ref 5.0–8.0)
PROTEIN: NEGATIVE mg/dL
Specific Gravity, Urine: 1.027 (ref 1.005–1.030)

## 2017-11-09 LAB — CBC
HEMATOCRIT: 41.5 % (ref 36.0–46.0)
Hemoglobin: 13.2 g/dL (ref 12.0–15.0)
MCH: 29.8 pg (ref 26.0–34.0)
MCHC: 31.8 g/dL (ref 30.0–36.0)
MCV: 93.7 fL (ref 78.0–100.0)
PLATELETS: 178 10*3/uL (ref 150–400)
RBC: 4.43 MIL/uL (ref 3.87–5.11)
RDW: 14.9 % (ref 11.5–15.5)
WBC: 9.6 10*3/uL (ref 4.0–10.5)

## 2017-11-09 LAB — I-STAT TROPONIN, ED: Troponin i, poc: 0 ng/mL (ref 0.00–0.08)

## 2017-11-09 LAB — LIPASE, BLOOD: Lipase: 37 U/L (ref 11–51)

## 2017-11-09 MED ORDER — METRONIDAZOLE IN NACL 5-0.79 MG/ML-% IV SOLN
500.0000 mg | Freq: Three times a day (TID) | INTRAVENOUS | Status: DC
  Administered 2017-11-09 – 2017-11-13 (×13): 500 mg via INTRAVENOUS
  Filled 2017-11-09 (×14): qty 100

## 2017-11-09 MED ORDER — PIPERACILLIN-TAZOBACTAM 3.375 G IVPB 30 MIN
3.3750 g | Freq: Once | INTRAVENOUS | Status: AC
  Administered 2017-11-09: 3.375 g via INTRAVENOUS
  Filled 2017-11-09: qty 50

## 2017-11-09 MED ORDER — FENTANYL CITRATE (PF) 100 MCG/2ML IJ SOLN
25.0000 ug | INTRAMUSCULAR | Status: DC | PRN
  Administered 2017-11-09: 25 ug via INTRAVENOUS
  Filled 2017-11-09: qty 2

## 2017-11-09 MED ORDER — SODIUM CHLORIDE 0.9 % IV SOLN
INTRAVENOUS | Status: AC
  Administered 2017-11-09 – 2017-11-10 (×2): via INTRAVENOUS

## 2017-11-09 MED ORDER — ALBUTEROL SULFATE (2.5 MG/3ML) 0.083% IN NEBU
2.5000 mg | INHALATION_SOLUTION | Freq: Four times a day (QID) | RESPIRATORY_TRACT | Status: DC | PRN
  Administered 2017-11-10 – 2017-11-12 (×3): 2.5 mg via RESPIRATORY_TRACT
  Filled 2017-11-09 (×3): qty 3

## 2017-11-09 MED ORDER — SENNOSIDES-DOCUSATE SODIUM 8.6-50 MG PO TABS
1.0000 | ORAL_TABLET | Freq: Every evening | ORAL | Status: DC | PRN
  Administered 2017-11-13: 1 via ORAL
  Filled 2017-11-09: qty 1

## 2017-11-09 MED ORDER — ONDANSETRON HCL 4 MG/2ML IJ SOLN
4.0000 mg | Freq: Once | INTRAMUSCULAR | Status: AC
  Administered 2017-11-09: 4 mg via INTRAVENOUS
  Filled 2017-11-09: qty 2

## 2017-11-09 MED ORDER — ONDANSETRON HCL 40 MG/20ML IJ SOLN
8.0000 mg | Freq: Four times a day (QID) | INTRAMUSCULAR | Status: DC | PRN
  Filled 2017-11-09 (×2): qty 4

## 2017-11-09 MED ORDER — GADOBENATE DIMEGLUMINE 529 MG/ML IV SOLN
15.0000 mL | Freq: Once | INTRAVENOUS | Status: AC | PRN
  Administered 2017-11-09: 15 mL via INTRAVENOUS

## 2017-11-09 MED ORDER — FENTANYL CITRATE (PF) 100 MCG/2ML IJ SOLN
12.5000 ug | INTRAMUSCULAR | Status: DC | PRN
  Administered 2017-11-10 – 2017-11-12 (×7): 12.5 ug via INTRAVENOUS
  Filled 2017-11-09 (×8): qty 2

## 2017-11-09 NOTE — ED Provider Notes (Signed)
Patient placed in Quick Look pathway, seen and evaluated   Chief Complaint: abdominal pain radiating to back  HPI:  Patient presents for 10hr history of sudden onset epigastric, RUQ abdominal pain radiating to her back. Cannot tell if it is chest or abdominal in origin. Reports 5 episodes of NBNB emesis. Daughter ate the same food for dinner last night and is feeling well. Reports some SOB as well. Had an annual physical done yesterday and felt fine. Denies fevers, diarrhea, urinary symptoms, hemoptysis, prior MI, PE.  ROS: abdominal pain  Physical Exam:   Gen: No distress  Neuro: Awake and Alert  Skin: Warm    Focused Exam: epigastric and RUQ abdominal pain. No rebound or guarding. RRR, lungs CTAB.   Initiation of care has begun. The patient has been counseled on the process, plan, and necessity for staying for the completion/evaluation, and the remainder of the medical screening examination    Delia Heady, PA-C 11/09/17 1327    Carmin Muskrat, MD 11/09/17 [phone]

## 2017-11-09 NOTE — Assessment & Plan Note (Signed)
Rx for anoro as this was more helpful than spiriva and continue albuterol prn. Stop spiriva.

## 2017-11-09 NOTE — ED Notes (Signed)
Pt ambulate to bathroom without complaint Pt returned to bed with family @ bedside

## 2017-11-09 NOTE — ED Provider Notes (Signed)
  Physical Exam  BP 137/68 (BP Location: Right Arm)   Pulse 80   Temp 97.9 F (36.6 C) (Oral)   Resp 19   SpO2 96%   Physical Exam  ED Course/Procedures     Procedures  MDM  Patient admitted to hospitalist for choledocholithiasis. I called Dr. Lyndel Safe from GI, who agreed with MRCP. Recommend clear liquids for now and NPO after midnight. He wants empiric abx coverage with zosyn. I talked to Dr. Rosendo Gros from surgery to see patient.       Drenda Freeze, MD 11/09/17 [phone]

## 2017-11-09 NOTE — ED Triage Notes (Signed)
Pt arrives via EMS complaining of epigastric pain that goes through to her back. Pt complains of nausea as well. Pain worsens with movement. Pt received zofran from EMS with relief. BP 134/62

## 2017-11-09 NOTE — ED Notes (Signed)
Patient transported to MRI 

## 2017-11-09 NOTE — H&P (Signed)
History and Physical    Zeppelin Beckstrand JSE:[id number] DOB: [birth year] DOA: 11/09/2017  PCP: Hoyt Koch, MD Patient coming from: home  Chief Complaint: abdominal pain/nausea and vomiting  HPI: Tieshia Rettinger is a very pleasant 72 y.o. female with medical history significant for hypertension,GERD, arthritis, presents to emergency Department chief complaint persistent worsening abdominal pain with nausea and vomiting. Initial evaluation includes abdominal ultrasound revealing choledocholithiasis, elevated transaminase. [facility] are asked to admit.  Information is obtained from the chart and the patient. Patient states she was "perfectly fine" when she went to bed last night at 11 PM. She awakened at 4 AM with the sudden onset of abdominal pain nausea and vomiting. She states the pain is located in epigastric area radiates to her right side. She describes the pain as sharp worse with movement. She states she has also had several episodes of vomiting since 4 AM. He states the emesis is nonbloody. She denies chest pain palpitation shortness of breath. She denies any fever chills diarrhea constipation melena bright red blood per rectum. She denies dysuria hematuria frequency or urgency. She denies headache dizziness syncope or near-syncope.    ED Course: emergency department she's afebrile hemodynamically stable and not hypoxic. She is provided with IV fluids analgesia and antimanic.  Review of Systems: As per HPI otherwise all other systems reviewed and are negative.   Ambulatory Status:lives at home in [place] with her family ambulates with a walker unsteady gait no recent falls related to RA  Past Medical History:  Diagnosis Date  . Allergy   . Arthritis   . Asthma   . Choledocholithiasis   . Emphysema of lung ([place])   . Neuromuscular disorder ([place])   . Osteoporosis   . Wears dentures    full upper    Past Surgical History:  Procedure Laterality Date  . APPENDECTOMY     . BREAST EXCISIONAL BIOPSY    . CATARACT EXTRACTION W/PHACO Left 06/16/2016   Procedure: CATARACT EXTRACTION PHACO AND INTRAOCULAR LENS PLACEMENT ([place])  Left;  Surgeon: Eulogio Bear, MD;  Location: [place];  Service: Ophthalmology;  Laterality: Left;  Left eye IVA Topical  . CATARACT EXTRACTION W/PHACO Right 07/07/2016   Procedure: CATARACT EXTRACTION PHACO AND INTRAOCULAR LENS PLACEMENT ([place])  Right;  Surgeon: Eulogio Bear, MD;  Location: [place];  Service: Ophthalmology;  Laterality: Right;  . COLON SURGERY    . SMALL INTESTINE SURGERY      Social History   Socioeconomic History  . Marital status: Divorced    Spouse name: Not on file  . Number of children: Not on file  . Years of education: Not on file  . Highest education level: Not on file  Occupational History  . Not on file  Social Needs  . Financial resource strain: Not hard at all  . Food insecurity:    Worry: Never true    Inability: Never true  . Transportation needs:    Medical: No    Non-medical: No  Tobacco Use  . Smoking status: Former Smoker    Last attempt to quit: 05/01/2007    Years since quitting: 10.5  . Smokeless tobacco: Current User    Types: Snuff  Substance and Sexual Activity  . Alcohol use: No    Alcohol/week: 0.0 oz  . Drug use: No  . Sexual activity: Never  Lifestyle  . Physical activity:    Days per week: 6 days    Minutes per session: 40 min  .  Stress: Not at all  Relationships  . Social connections:    Talks on phone: More than three times a week    Gets together: More than three times a week    Attends religious service: Not on file    Active member of club or organization: Not on file    Attends meetings of clubs or organizations: Not on file    Relationship status: Divorced  . Intimate partner violence:    Fear of current or ex partner: Not on file    Emotionally abused: Not on file    Physically abused: Not on file    Forced sexual activity:  Not on file  Other Topics Concern  . Not on file  Social History Narrative  . Not on file    Allergies  Allergen Reactions  . Vit D-Vit E-Safflower Oil     unknown  . Codeine Rash    Family History  Problem Relation Age of Onset  . Arthritis Mother   . Hypertension Mother   . Arthritis Father   . Cancer Father        colon  . Hypertension Father     Prior to Admission medications   Medication Sig Start Date End Date Taking? Authorizing Provider  Adalimumab (HUMIRA) 40 MG/0.8ML PSKT Inject into the skin.    [provider]  albuterol (PROVENTIL) (2.5 MG/3ML) 0.083% nebulizer solution Take 3 mLs (2.5 mg total) by nebulization every 6 (six) hours as needed for wheezing or shortness of breath. 10/11/17   Hoyt Koch, MD  COD LIVER OIL PO Take by mouth daily.    [provider]  folic acid (FOLVITE) 1 MG tablet TAKE 1 TABLET BY MOUTH EVERY DAY 02/11/17   Hoyt Koch, MD  gabapentin (NEURONTIN) 300 MG capsule Take 1 capsule (300 mg total) by mouth 3 (three) times daily. 09/17/16 09/17/17  Triplett, Johnette Abraham B, FNP  methotrexate (RHEUMATREX) 2.5 MG tablet Take 5-7.5 mg by mouth 2 (two) times a week. Pt takes three tablets once daily on Sunday and two once daily on Monday.    [provider]  naproxen (NAPROSYN) 500 MG tablet Take 1 tablet (500 mg total) by mouth 2 (two) times daily with a meal. 09/29/16   Carrie Mew, MD  pantoprazole (PROTONIX) 40 MG tablet TAKE 1 TABLET BY MOUTH EVERY DAY 10/12/17   Hoyt Koch, MD  predniSONE (DELTASONE) 2.5 MG tablet Take 2.5 mg by mouth daily with breakfast.    [provider]  traMADol (ULTRAM-ER) 100 MG 24 hr tablet Take 1 tablet (100 mg total) by mouth daily. 11/08/17   Hoyt Koch, MD  umeclidinium-vilanterol (ANORO ELLIPTA) 62.5-25 MCG/INH AEPB Inhale 1 puff into the lungs daily. 11/08/17   Hoyt Koch, MD    Physical Exam: Vitals:   11/09/17 1057 11/09/17 1058  11/09/17 1245  BP: 136/71 136/71 137/68  Pulse: 79 79 80  Resp: 16 16 19   Temp: 97.9 F (36.6 C) 97.9 F (36.6 C)   TempSrc: Oral Oral   SpO2: 92% 92% 96%     General:  Appears slightly anxious slightly pale in mild distress. Eyes:  PERRL, EOMI, normal lids, iris ENT:  grossly normal hearing, lips & tongue, mucous membranes of her mouth are pink and quite dry Neck:  no LAD, masses or thyromegaly Cardiovascular:  RRR, no m/r/g. No LE edema.  Respiratory:  CTA bilaterally, no w/r/r. Normal respiratory effort. Abdomen:  soft, positive bowel sounds throughout but sluggish  mild to moderate tenderness particularly in epigastric area. No guarding or rebounding Skin:  no rash or induration seen on limited exam Musculoskeletal:  grossly normal tone BUE/BLE, good ROM, no bony abnormality Psychiatric:  grossly normal mood and affect, speech fluent and appropriate, AOx3 Neurologic:  CN 2-12 grossly intact, moves all extremities in coordinated fashion, sensation intact speech clear facial symmetry  Labs on Admission: I have personally reviewed following labs and imaging studies  CBC: Recent Labs  Lab 11/09/17 1101  WBC 9.6  HGB 13.2  HCT 41.5  MCV 93.7  PLT 671   Basic Metabolic Panel: Recent Labs  Lab 11/08/17 1632 11/09/17 1101  NA 138 142  K 3.8 4.0  CL 104 107  CO2 26 26  GLUCOSE 115* 177*  BUN 26* 20  CREATININE 0.92 1.01*  CALCIUM 9.3 9.7   GFR: Estimated Creatinine Clearance: 45.2 mL/min (A) (by C-G formula based on SCr of 1.01 mg/dL (H)). Liver Function Tests: Recent Labs  Lab 11/08/17 1632 11/09/17 1101  AST 22 222*  ALT 32 177*  ALKPHOS 76 109  BILITOT 0.6 3.1*  PROT 6.5 7.0  ALBUMIN 4.1 3.9   Recent Labs  Lab 11/09/17 1101  LIPASE 37   No results for input(s): AMMONIA in the last 168 hours. Coagulation Profile: No results for input(s): INR, PROTIME in the last 168 hours. Cardiac Enzymes: No results for input(s): CKTOTAL, CKMB, CKMBINDEX,  TROPONINI in the last 168 hours. BNP (last 3 results) No results for input(s): PROBNP in the last 8760 hours. HbA1C: Recent Labs    11/08/17 1632  HGBA1C 5.4   CBG: No results for input(s): GLUCAP in the last 168 hours. Lipid Profile: Recent Labs    11/08/17 1632  CHOL 253*  HDL 65.20  LDLCALC 152*  TRIG 182.0*  CHOLHDL 4   Thyroid Function Tests: No results for input(s): TSH, T4TOTAL, FREET4, T3FREE, THYROIDAB in the last 72 hours. Anemia Panel: No results for input(s): VITAMINB12, FOLATE, FERRITIN, TIBC, IRON, RETICCTPCT in the last 72 hours. Urine analysis:    Component Value Date/Time   BILIRUBINUR negative 05/01/2015 1642   KETONESUR negative 05/01/2015 1642   PROTEINUR negative 05/01/2015 1642   UROBILINOGEN 0.2 05/01/2015 1642   NITRITE Positive (A) 05/01/2015 1642   LEUKOCYTESUR small (1+) (A) 05/01/2015 1642    Creatinine Clearance: Estimated Creatinine Clearance: 45.2 mL/min (A) (by C-G formula based on SCr of 1.01 mg/dL (H)).  Sepsis Labs: @LABRCNTIP (procalcitonin:4,lacticidven:4) )No results found for this or any previous visit (from the past 240 hour(s)).   Radiological Exams on Admission: Dg Chest 2 View  Result Date: 11/09/2017 CLINICAL DATA:  Chest pain.  Shortness of breath. EXAM: CHEST - 2 VIEW COMPARISON:  10/08/2017. FINDINGS: Mediastinum hilar structures normal. Low lung volumes with mild bibasilar atelectasis/infiltrates. No pleural effusion or pneumothorax. Biapical pleural thickening noted consistent scarring. No acute bony abnormality. IMPRESSION: Low lung volumes with mild bibasilar atelectasis/infiltrates. Electronically Signed   By: Marcello Moores  Register   On: 11/09/2017 13:51   US Abdomen Complete  Result Date: 11/09/2017 CLINICAL DATA:  72 year old female with acute abdominal pain. EXAM: ABDOMEN ULTRASOUND COMPLETE COMPARISON:  None. FINDINGS: Gallbladder: Positive for flat oval nonshadowing gallstones in the fundus (image 22) estimated up to  11 millimeters. Gallbladder wall thickness remains normal. Gallbladder size is at the upper limits of normal, but no pericholecystic fluid and no sonographic Murphy sign are noted. Common bile duct: Diameter: 14 millimeters diameter, severely dilated (image 34, normal up to 6 millimeters diameter). Liver:  Mildly to moderately increased liver echogenicity (image 41). No intrahepatic biliary ductal dilatation is evident. No discrete liver lesion. Portal vein is patent on color Doppler imaging with normal direction of blood flow towards the liver. IVC: No abnormality visualized. Pancreas: Visualized portion unremarkable. Spleen: Size and appearance within normal limits. Right Kidney: Length: 9.7 centimeters. Echogenicity within normal limits. No mass or hydronephrosis visualized. Left Kidney: Length: 7.1 centimeters, asymmetrically smaller. Echogenicity within normal limits. No mass or hydronephrosis visualized. Abdominal aorta: Incompletely visualized due to overlying bowel gas, visualized portions within normal limits. Other findings: None. IMPRESSION: 1. Severely dilated common bile duct, up to 14 mm. Superimposed cholelithiasis measuring about 11 mm. This constellation is suspicious for Acute Choledocholithiasis. 2. No evidence of acute cholecystitis. 3. Hepatic steatosis. Electronically Signed   By: Genevie Ann M.D.   On: 11/09/2017 15:13    EKG: Independently reviewed. Normal sinus rhythm Nonspecific ST abnormality  Assessment/Plan Principal Problem:   Choledocholithiasis Active Problems:   Essential hypertension   Elevated transaminase level   COPD (chronic obstructive pulmonary disease) (HCC)   Hyperlipidemia   Rheumatoid arthritis ([place])   #1. Choledocholithiasis. Abdominal ultrasound reveals severely dilated common bile duct up to 14 mm. Superimpose cholelithiasis measuring about 11 mm. No evidence of acute cholecystitis, hepatic steatosis. She has elevated transaminase. No leukocytosis no fever  hemodynamically stable -admit -IV fluids -flagyl -MRCP -npo -analgesia -anti-emetic -await gi recommendations -await general surgery recommendations  #2. Elevated transaminase level. Related to above. Total bili 3.58m ALT177, AST 222,lipase within the limits of normal alkaline phosphatase within the limits of normal. -see #1 -iv fluids -monitor  # 3. Hypertension. Controlled in the emergency department.home medications include no antihypertesive meds -monitor  Rheumatoid arthritis. Appears stable at baseline. Home medications include Humira, tramadol, methotrexate, Deltasone. -Holding home medications for now -we will resume as indicated  #5. COPD. Stable at baseline. Chest x-ray as noted above -continue home meds    DVT prophylaxis: scd Code Status: full  Family Communication: none present  Disposition Plan: home  Consults called: gi [place]  Admission status: inpatient    Radene Gunning MD [facility]  If 7PM-7AM, please contact night-coverage [email or web address] Password [facility]  11/09/2017, 5:34 PM

## 2017-11-09 NOTE — ED Notes (Signed)
Pt back in hallway from MRI

## 2017-11-09 NOTE — Assessment & Plan Note (Signed)
Flu shot yearly reminded. Pneumonia completed. Shingrix counseled. Tetanus up to date. Colonoscopy up to date. Mammogram up to date, pap smear not indicated and dexa up to date. Counseled about sun safety and mole surveillance. Counseled about the dangers of distracted driving. Given 10 year screening recommendations.

## 2017-11-09 NOTE — ED Provider Notes (Signed)
[facility] EMERGENCY DEPARTMENT Provider Note   CSN: [id number] Arrival date & time: 11/09/17  1049     History   Chief Complaint Chief Complaint  Patient presents with  . Abdominal Pain    HPI Sandra Schwartz is a 72 y.o. female.  HPI   She presents for evaluation of upper abdominal pain, intermittently for years, but in the last 2 days the pain has been persistent and worse than usual.  She has occasional bouts of nausea but no vomiting.  She denies fever, chills, weakness or dizziness.  No prior diagnosis of gallbladder disease.  There are no other known modifying factors.  Past Medical History:  Diagnosis Date  . Allergy   . Arthritis   . Asthma   . Emphysema of lung ([place])   . Neuromuscular disorder ([place])   . Osteoporosis   . Wears dentures    full upper    Patient Active Problem List   Diagnosis Date Noted  . Routine general medical examination at a health care facility 11/09/2017  . Thoracic back pain 10/09/2016  . COPD (chronic obstructive pulmonary disease) ([place]) 06/13/2015  . Generalized anxiety disorder 06/13/2015  . Hyperlipidemia 06/13/2015  . Rheumatoid arthritis ([place]) 06/13/2015  . Thyroid disorder 06/13/2015  . Essential hypertension 06/13/2015  . Atrophic vaginitis 06/13/2015    Past Surgical History:  Procedure Laterality Date  . APPENDECTOMY    . BREAST EXCISIONAL BIOPSY    . CATARACT EXTRACTION W/PHACO Left 06/16/2016   Procedure: CATARACT EXTRACTION PHACO AND INTRAOCULAR LENS PLACEMENT ([place])  Left;  Surgeon: Eulogio Bear, MD;  Location: [facility];  Service: Ophthalmology;  Laterality: Left;  Left eye IVA Topical  . CATARACT EXTRACTION W/PHACO Right 07/07/2016   Procedure: CATARACT EXTRACTION PHACO AND INTRAOCULAR LENS PLACEMENT ([place])  Right;  Surgeon: Eulogio Bear, MD;  Location: [facility];  Service: Ophthalmology;  Laterality: Right;  . COLON SURGERY    . SMALL INTESTINE SURGERY       OB  History   None      Home Medications    Prior to Admission medications   Medication Sig Start Date End Date Taking? Authorizing Provider  Adalimumab (HUMIRA) 40 MG/0.8ML PSKT Inject into the skin.    [provider]  albuterol (PROVENTIL) (2.5 MG/3ML) 0.083% nebulizer solution Take 3 mLs (2.5 mg total) by nebulization every 6 (six) hours as needed for wheezing or shortness of breath. 10/11/17   Hoyt Koch, MD  COD LIVER OIL PO Take by mouth daily.    [provider]  folic acid (FOLVITE) 1 MG tablet TAKE 1 TABLET BY MOUTH EVERY DAY 02/11/17   Hoyt Koch, MD  gabapentin (NEURONTIN) 300 MG capsule Take 1 capsule (300 mg total) by mouth 3 (three) times daily. 09/17/16 09/17/17  Triplett, Johnette Abraham B, FNP  methotrexate (RHEUMATREX) 2.5 MG tablet Take 5-7.5 mg by mouth 2 (two) times a week. Pt takes three tablets once daily on Sunday and two once daily on Monday.    [provider]  naproxen (NAPROSYN) 500 MG tablet Take 1 tablet (500 mg total) by mouth 2 (two) times daily with a meal. 09/29/16   Carrie Mew, MD  pantoprazole (PROTONIX) 40 MG tablet TAKE 1 TABLET BY MOUTH EVERY DAY 10/12/17   Hoyt Koch, MD  predniSONE (DELTASONE) 2.5 MG tablet Take 2.5 mg by mouth daily with breakfast.    [provider]  traMADol (ULTRAM-ER) 100 MG 24 hr tablet Take  1 tablet (100 mg total) by mouth daily. 11/08/17   Hoyt Koch, MD  umeclidinium-vilanterol (ANORO ELLIPTA) 62.5-25 MCG/INH AEPB Inhale 1 puff into the lungs daily. 11/08/17   Hoyt Koch, MD    Family History Family History  Problem Relation Age of Onset  . Arthritis Mother   . Hypertension Mother   . Arthritis Father   . Cancer Father        colon  . Hypertension Father     Social History Social History   Tobacco Use  . Smoking status: Former Smoker    Last attempt to quit: 05/01/2007    Years since quitting: 10.5  . Smokeless tobacco: Current User     Types: Snuff  Substance Use Topics  . Alcohol use: No    Alcohol/week: 0.0 oz  . Drug use: No     Allergies   Vit d-vit e-safflower oil and Codeine   Review of Systems Review of Systems  All other systems reviewed and are negative.    Physical Exam Updated Vital Signs BP 137/68 (BP Location: Right Arm)   Pulse 80   Temp 97.9 F (36.6 C) (Oral)   Resp 19   SpO2 96%   Physical Exam  Constitutional: She is oriented to person, place, and time. She appears well-developed and well-nourished. She does not appear ill.  Elderly, frail  HENT:  Head: Normocephalic and atraumatic.  Eyes: Pupils are equal, round, and reactive to light. Conjunctivae and EOM are normal.  Neck: Normal range of motion and phonation normal. Neck supple.  Cardiovascular: Normal rate and regular rhythm.  Pulmonary/Chest: Effort normal and breath sounds normal. No respiratory distress. She exhibits no tenderness.  Abdominal: Soft. She exhibits no distension and no mass. There is tenderness (Right upper quadrant, mild). There is no guarding.  Musculoskeletal: Normal range of motion.  Neurological: She is alert and oriented to person, place, and time. She exhibits normal muscle tone.  Skin: Skin is warm and dry.  Psychiatric: She has a normal mood and affect. Her behavior is normal. Judgment and thought content normal.  Nursing note and vitals reviewed.    ED Treatments / Results  Labs (all labs ordered are listed, but only abnormal results are displayed) Labs Reviewed  COMPREHENSIVE METABOLIC PANEL - Abnormal; Notable for the following components:      Result Value   Glucose, Bld 177 (*)    Creatinine, Ser 1.01 (*)    AST 222 (*)    ALT 177 (*)    Total Bilirubin 3.1 (*)    GFR calc non Af Amer 54 (*)    All other components within normal limits  LIPASE, BLOOD  CBC  URINALYSIS, ROUTINE W REFLEX MICROSCOPIC  I-STAT TROPONIN, ED    EKG None  Radiology Dg Chest 2 View  Result Date:  11/09/2017 CLINICAL DATA:  Chest pain.  Shortness of breath. EXAM: CHEST - 2 VIEW COMPARISON:  10/08/2017. FINDINGS: Mediastinum hilar structures normal. Low lung volumes with mild bibasilar atelectasis/infiltrates. No pleural effusion or pneumothorax. Biapical pleural thickening noted consistent scarring. No acute bony abnormality. IMPRESSION: Low lung volumes with mild bibasilar atelectasis/infiltrates. Electronically Signed   By: Marcello Moores  Register   On: 11/09/2017 13:51    Procedures Procedures (including critical care time)  Medications Ordered in ED Medications - No data to display   Initial Impression / Assessment and Plan / ED Course  I have reviewed the triage vital signs and the nursing notes.  Pertinent labs & imaging  results that were available during my care of the patient were reviewed by me and considered in my medical decision making (see chart for details).      Patient Vitals for the past 24 hrs:  BP Temp Temp src Pulse Resp SpO2  11/09/17 1245 137/68 - - 80 19 96 %  11/09/17 1058 136/71 97.9 F (36.6 C) Oral 79 16 92 %  11/09/17 1057 136/71 97.9 F (36.6 C) Oral 79 16 92 %    5:02 PM Reevaluation with update and discussion. After initial assessment and treatment, an updated evaluation reveals patient more comfortable now after treatment.  Findings and plan discussed. Daleen Bo   Medical Decision Making: Choledocholithiasis, symptomatic, no clear evidence for cholecystitis.  Patient will require admission for bowel rest, GI consultation, and possible surgical intervention.  CRITICAL CARE-no Performed by: Daleen Bo   Nursing Notes Reviewed/ Care Coordinated Applicable Imaging Reviewed Interpretation of Laboratory Data incorporated into ED treatment  5:08 PM-Consult complete with hospitalist. Patient case explained and discussed.  She agrees to admit patient for further evaluation and treatment. Call ended at 5:07 PM  Page placed to gastroenterology to  arrange for consultation regarding gallstone  Page placed to surgery to arrange for consultation regarding possible surgical intervention for gallbladder disease.   Final Clinical Impressions(s) / ED Diagnoses   Final diagnoses:  RUQ abdominal pain  Choledocholithiasis    ED Discharge Orders    None       Daleen Bo, MD 11/09/17 1710

## 2017-11-09 NOTE — Telephone Encounter (Signed)
Daughter called and reported she is on her way to patients home, due to peristent vomiting since 4:00 AM. Reported the pt. had a physical yesterday, and seemed fine. Upon arrival at the pt's home, reported the  pt. appears very weak.  Has vomited about 5 times, since 4:00 AM.  C/o pain under left breast/ rib cage, and in her back.  C/o shortness of breath; "it hurts to take a deep breath."  Mouth very dry.  Only urinated once this AM.  Advised to send to ER for eval.  Recommended calling 911, due to severity of symptoms.  Daughter agrees with plan; stated her husband is call EMS at this time.  Will make Dr. Sharlet Salina aware.              Reason for Disposition . [1] MODERATE vomiting (e.g., 3 - 5 times/day) AND [2] age > 45  Answer Assessment - Initial Assessment Questions 1. VOMITING SEVERITY: "How many times have you vomited in the past 24 hours?"     - MILD:  1 - 2 times/day    - MODERATE: 3 - 5 times/day, decreased oral intake without significant weight loss or symptoms of dehydration    - SEVERE: 6 or more times/day, vomits everything or nearly everything, with significant weight loss, symptoms of dehydration      severe 2. ONSET: "When did the vomiting begin?"      At approx. 4:00 AM this morning 3. FLUIDS: "What fluids or food have you vomited up today?" "Have you been able to keep any fluids down?"     Vomited frequently 4. ABDOMINAL PAIN: "Are your having any abdominal pain?" If yes : "How bad is it and what does it feel like?" (e.g., crampy, dull, intermittent, constant)      Daughter doesn't know 5. DIARRHEA: "Is there any diarrhea?" If so, ask: "How many times today?"      *No Answer* 6. CONTACTS: "Is there anyone else in the family with the same symptoms?"      *No Answer* 7. CAUSE: "What do you think is causing your vomiting?"     *No Answer* 8. HYDRATION STATUS: "Any signs of dehydration?" (e.g., dry mouth [not only dry lips], too weak to stand) "When did you last urinate?"  *No Answer* 9. OTHER SYMPTOMS: "Do you have any other symptoms?" (e.g., fever, headache, vertigo, vomiting blood or coffee grounds, recent head injury)    "Hot on inside and cold on outside"; c/o back and chest discomfort;  Protocols used: [facility]

## 2017-11-09 NOTE — Assessment & Plan Note (Signed)
BP at goal off meds. Checking CMP and adjust as needed. 

## 2017-11-09 NOTE — Assessment & Plan Note (Signed)
Refill tramadol although she does not need refill today. Uses for severe pain and humira through rheumatology and prednisone daily.

## 2017-11-09 NOTE — ED Notes (Signed)
Family member of Pt inquired about wait time. This tech explained the process. At the time of inquiry the pt is the longest wait of 2hrs15min. Pt wanting to leave but this tech encouraged her to stay. RN notified and pt willing to stay at this time.

## 2017-11-09 NOTE — ED Notes (Signed)
ED Provider at bedside. 

## 2017-11-09 NOTE — ED Notes (Signed)
Patient transported to Ultrasound 

## 2017-11-09 NOTE — Assessment & Plan Note (Signed)
Checking lipid panel and adjust coil liver oil as needed.

## 2017-11-09 NOTE — Consult Note (Signed)
Reason for Consult: Abdominal pain Referring Physician: Dr. Girtha Hake is an 72 y.o. female.  HPI: Patient is a 72 year old female with a history of hyperlipidemia, rheumatoid arthritis, hypertension, COPD comes in with a 1 day history of epigastric abdominal pain/right upper quadrant.  Patient states that this is associated nausea vomiting.  Patient's daughter states that she felt that this was not a stomach bug extremity she had this in the recent winter however the pain did not progress.  Secondary to continued pain patient was seen in the ER and evaluated.  Upon evaluation in the ER she underwent radiological studies which revealed cholelithiasis as well as choledocholithiasis, enlarged common bile duct.  I did review these studies personally.  Secondary to this juncture is consulted for further evaluation and management.  Past Medical History:  Diagnosis Date  . Allergy   . Arthritis   . Asthma   . Choledocholithiasis   . Emphysema of lung ([place])   . Neuromuscular disorder ([place])   . Osteoporosis   . Wears dentures    full upper    Past Surgical History:  Procedure Laterality Date  . APPENDECTOMY    . BREAST EXCISIONAL BIOPSY    . CATARACT EXTRACTION W/PHACO Left 06/16/2016   Procedure: CATARACT EXTRACTION PHACO AND INTRAOCULAR LENS PLACEMENT ([place])  Left;  Surgeon: Eulogio Bear, MD;  Location: [facility];  Service: Ophthalmology;  Laterality: Left;  Left eye IVA Topical  . CATARACT EXTRACTION W/PHACO Right 07/07/2016   Procedure: CATARACT EXTRACTION PHACO AND INTRAOCULAR LENS PLACEMENT ([place])  Right;  Surgeon: Eulogio Bear, MD;  Location: [facility];  Service: Ophthalmology;  Laterality: Right;  . COLON SURGERY    . SMALL INTESTINE SURGERY      Family History  Problem Relation Age of Onset  . Arthritis Mother   . Hypertension Mother   . Arthritis Father   . Cancer Father        colon  . Hypertension Father     Social History:  reports that  she quit smoking about 10 years ago. Her smokeless tobacco use includes snuff. She reports that she does not drink alcohol or use drugs.  Allergies:  Allergies  Allergen Reactions  . Vit D-Vit E-Safflower Oil Shortness Of Breath and Nausea And Vomiting  . Codeine Rash    Medications: I have reviewed the patient's current medications.  Results for orders placed or performed during the hospital encounter of 11/09/17 (from the past 48 hour(s))  Lipase, blood     Status: None   Collection Time: 11/09/17 11:01 AM  Result Value Ref Range   Lipase 37 11 - 51 U/L    Comment: Performed at [facility], [address], [place], [place] [address]  Comprehensive metabolic panel     Status: Abnormal   Collection Time: 11/09/17 11:01 AM  Result Value Ref Range   Sodium 142 135 - 145 mmol/L   Potassium 4.0 3.5 - 5.1 mmol/L    Comment: SPECIMEN HEMOLYZED. HEMOLYSIS MAY AFFECT INTEGRITY OF RESULTS.   Chloride 107 101 - 111 mmol/L   CO2 26 22 - 32 mmol/L   Glucose, Bld 177 (H) 65 - 99 mg/dL   BUN 20 6 - 20 mg/dL   Creatinine, Ser 1.01 (H) 0.44 - 1.00 mg/dL   Calcium 9.7 8.9 - 10.3 mg/dL   Total Protein 7.0 6.5 - 8.1 g/dL   Albumin 3.9 3.5 - 5.0 g/dL   AST 222 (H) 15 -  41 U/L   ALT 177 (H) 14 - 54 U/L   Alkaline Phosphatase 109 38 - 126 U/L   Total Bilirubin 3.1 (H) 0.3 - 1.2 mg/dL   GFR calc non Af Amer 54 (L) >60 mL/min   GFR calc Af Amer >60 >60 mL/min    Comment: (NOTE) The eGFR has been calculated using the CKD EPI equation. This calculation has not been validated in all clinical situations. eGFR's persistently <60 mL/min signify possible Chronic Kidney Disease.    Anion gap 9 5 - 15    Comment: Performed at [facility] [address], [place], [place] [address]  CBC     Status: None   Collection Time: 11/09/17 11:01 AM  Result Value Ref Range   WBC 9.6 4.0 - 10.5 K/uL   RBC 4.43 3.87 - 5.11 MIL/uL   Hemoglobin 13.2 12.0 - 15.0 g/dL   HCT 41.5 36.0 - 46.0 %   MCV  93.7 78.0 - 100.0 fL   MCH 29.8 26.0 - 34.0 pg   MCHC 31.8 30.0 - 36.0 g/dL   RDW 14.9 11.5 - 15.5 %   Platelets 178 150 - 400 K/uL    Comment: Performed at [facility], [place] [address], [place], [place] [address]  I-stat troponin, ED     Status: None   Collection Time: 11/09/17  2:10 PM  Result Value Ref Range   Troponin i, poc 0.00 0.00 - 0.08 ng/mL   Comment 3            Comment: Due to the release kinetics of cTnI, a negative result within the first hours of the onset of symptoms does not rule out myocardial infarction with certainty. If myocardial infarction is still suspected, repeat the test at appropriate intervals.     Dg Chest 2 View  Result Date: 11/09/2017 CLINICAL DATA:  Chest pain.  Shortness of breath. EXAM: CHEST - 2 VIEW COMPARISON:  10/08/2017. FINDINGS: Mediastinum hilar structures normal. Low lung volumes with mild bibasilar atelectasis/infiltrates. No pleural effusion or pneumothorax. Biapical pleural thickening noted consistent scarring. No acute bony abnormality. IMPRESSION: Low lung volumes with mild bibasilar atelectasis/infiltrates. Electronically Signed   By: Marcello Moores  Register   On: 11/09/2017 13:51   US Abdomen Complete  Result Date: 11/09/2017 CLINICAL DATA:  72 year old female with acute abdominal pain. EXAM: ABDOMEN ULTRASOUND COMPLETE COMPARISON:  None. FINDINGS: Gallbladder: Positive for flat oval nonshadowing gallstones in the fundus (image 22) estimated up to 11 millimeters. Gallbladder wall thickness remains normal. Gallbladder size is at the upper limits of normal, but no pericholecystic fluid and no sonographic Murphy sign are noted. Common bile duct: Diameter: 14 millimeters diameter, severely dilated (image 34, normal up to 6 millimeters diameter). Liver: Mildly to moderately increased liver echogenicity (image 41). No intrahepatic biliary ductal dilatation is evident. No discrete liver lesion. Portal vein is patent on color Doppler imaging with  normal direction of blood flow towards the liver. IVC: No abnormality visualized. Pancreas: Visualized portion unremarkable. Spleen: Size and appearance within normal limits. Right Kidney: Length: 9.7 centimeters. Echogenicity within normal limits. No mass or hydronephrosis visualized. Left Kidney: Length: 7.1 centimeters, asymmetrically smaller. Echogenicity within normal limits. No mass or hydronephrosis visualized. Abdominal aorta: Incompletely visualized due to overlying bowel gas, visualized portions within normal limits. Other findings: None. IMPRESSION: 1. Severely dilated common bile duct, up to 14 mm. Superimposed cholelithiasis measuring about 11 mm. This constellation is suspicious for Acute Choledocholithiasis. 2. No evidence of acute cholecystitis.  3. Hepatic steatosis. Electronically Signed   By: Genevie Ann M.D.   On: 11/09/2017 15:13    Review of Systems  Constitutional: Negative for chills, fever and malaise/fatigue.  HENT: Negative for ear discharge, hearing loss and sore throat.   Eyes: Negative for blurred vision and discharge.  Respiratory: Negative for cough and shortness of breath.   Cardiovascular: Negative for chest pain, orthopnea and leg swelling.  Gastrointestinal: Positive for abdominal pain, nausea and vomiting. Negative for constipation, diarrhea and heartburn.  Musculoskeletal: Negative for myalgias and neck pain.  Skin: Negative for itching and rash.  Neurological: Negative for dizziness, focal weakness, seizures and loss of consciousness.  Endo/Heme/Allergies: Negative for environmental allergies. Does not bruise/bleed easily.  Psychiatric/Behavioral: Negative for depression and suicidal ideas.  All other systems reviewed and are negative.  Blood pressure 137/68, pulse 80, temperature 97.9 F (36.6 C), temperature source Oral, resp. rate 19, SpO2 96 %. Physical Exam  Constitutional: She is oriented to person, place, and time. Vital signs are normal. She appears  well-developed and well-nourished.  Conversant No acute distress  Eyes: Lids are normal. No scleral icterus.  No lid lag Moist conjunctiva  Neck: No tracheal tenderness present. No thyromegaly present.  No cervical lymphadenopathy  Cardiovascular: Normal rate, regular rhythm and intact distal pulses.  No murmur heard. Respiratory: Effort normal and breath sounds normal. She has no wheezes. She has no rales.  GI: Soft. She exhibits no distension and no mass. There is no hepatosplenomegaly. There is tenderness in the right upper quadrant. There is no rebound and no guarding. No hernia.  Neurological: She is alert and oriented to person, place, and time.  Normal gait and station  Skin: Skin is warm. No rash noted. No cyanosis. Nails show no clubbing.  Normal skin turgor  Psychiatric: Judgment normal.  Appropriate affect    Assessment/Plan: 72 year old female with cholelithiasis, choledocholithiasis Hypertension COPD Hyperlipidemia  Plan: 1.  Patient will be undergoing MRCP per GI to evaluate common bile duct.  Subsequent to this patient will likely require an ERCP versus possible lap chole with IOC. 2.  Agree with antibiotics 3.  Continue n.p.o. 4.  We will discuss with Dr. Brantley Stage consider for surgery later this week.  Rosario Jacks., Leda [place] 11/09/2017, 5:59 PM

## 2017-11-10 ENCOUNTER — Encounter (HOSPITAL_COMMUNITY): Payer: Self-pay | Admitting: General Practice

## 2017-11-10 ENCOUNTER — Other Ambulatory Visit: Payer: Self-pay

## 2017-11-10 DIAGNOSIS — K8050 Calculus of bile duct without cholangitis or cholecystitis without obstruction: Secondary | ICD-10-CM

## 2017-11-10 DIAGNOSIS — R945 Abnormal results of liver function studies: Secondary | ICD-10-CM

## 2017-11-10 DIAGNOSIS — R740 Nonspecific elevation of levels of transaminase and lactic acid dehydrogenase [LDH]: Secondary | ICD-10-CM

## 2017-11-10 LAB — CBC
HEMATOCRIT: 34.5 % — AB (ref 36.0–46.0)
Hemoglobin: 11.2 g/dL — ABNORMAL LOW (ref 12.0–15.0)
MCH: 30.3 pg (ref 26.0–34.0)
MCHC: 32.5 g/dL (ref 30.0–36.0)
MCV: 93.2 fL (ref 78.0–100.0)
PLATELETS: 149 10*3/uL — AB (ref 150–400)
RBC: 3.7 MIL/uL — AB (ref 3.87–5.11)
RDW: 15.1 % (ref 11.5–15.5)
WBC: 9.3 10*3/uL (ref 4.0–10.5)

## 2017-11-10 LAB — COMPREHENSIVE METABOLIC PANEL
ALT: 152 U/L — ABNORMAL HIGH (ref 14–54)
AST: 94 U/L — ABNORMAL HIGH (ref 15–41)
Albumin: 3.1 g/dL — ABNORMAL LOW (ref 3.5–5.0)
Alkaline Phosphatase: 74 U/L (ref 38–126)
Anion gap: 6 (ref 5–15)
BUN: 16 mg/dL (ref 6–20)
CO2: 23 mmol/L (ref 22–32)
CREATININE: 0.92 mg/dL (ref 0.44–1.00)
Calcium: 8.6 mg/dL — ABNORMAL LOW (ref 8.9–10.3)
Chloride: 111 mmol/L (ref 101–111)
Glucose, Bld: 118 mg/dL — ABNORMAL HIGH (ref 65–99)
POTASSIUM: 3.6 mmol/L (ref 3.5–5.1)
Sodium: 140 mmol/L (ref 135–145)
Total Bilirubin: 6.5 mg/dL — ABNORMAL HIGH (ref 0.3–1.2)
Total Protein: 5.7 g/dL — ABNORMAL LOW (ref 6.5–8.1)

## 2017-11-10 MED ORDER — SODIUM CHLORIDE 0.9 % IV SOLN
2.0000 g | INTRAVENOUS | Status: DC
  Administered 2017-11-10 – 2017-11-13 (×5): 2 g via INTRAVENOUS
  Filled 2017-11-10 (×6): qty 20

## 2017-11-10 MED ORDER — ONDANSETRON HCL 4 MG/2ML IJ SOLN: 4.0000 mg | Freq: Once | INTRAMUSCULAR | Status: DC

## 2017-11-10 MED ORDER — ONDANSETRON HCL 4 MG/2ML IJ SOLN
4.0000 mg | Freq: Four times a day (QID) | INTRAMUSCULAR | Status: DC | PRN
  Administered 2017-11-10 – 2017-11-11 (×2): 4 mg via INTRAVENOUS
  Filled 2017-11-10 (×2): qty 2

## 2017-11-10 NOTE — H&P (View-Only) (Signed)
Unfortunately not able to accommodate ERCP onto the schedule today.  Her ERCP is rescheduled for 1 PM tomorrow.  Allow clears.  I will inform the patient and her daughter.  S Tran Arzuaga PA-C.

## 2017-11-10 NOTE — Progress Notes (Signed)
Received patient from ED. Patient alert oriented x4. Patient accompanied by daughter. Resting comfortably in bed. Will continue to monitor.

## 2017-11-10 NOTE — ED Notes (Signed)
Surgery and admit drs into see pt, pt to have ERCP then surgery , daughter at bedside

## 2017-11-10 NOTE — Progress Notes (Signed)
ERCP consent signed by daughter. Patient requested the daughter sign.

## 2017-11-10 NOTE — Consult Note (Addendum)
[facility] Consult: 8:09 AM 11/10/2017  LOS: 1 day    Referring Provider: Dr Florene Glen, Hospitalist  Primary Care Physician:  Hoyt Koch, MD Primary Gastroenterologist:  none     Reason for Consultation: Choledocholithiasis.   HPI: Sandra Schwartz is a 72 y.o. female.  PMH RA.  On Humira, MTX and periodic prednisone (none for the last month).  Emphysema.  Chronic anemia, microcytic in 2016 but normocytic 2019.   S/p partial colectomy due to obstructing polyp ~ 2011 in [place].  S/P remote right sided oophorectomy/salpingectomy and appendectomy. Since she had the colectomy, she has not had repeat colonoscopy.  She has never had upper endoscopy.  Going back at least a few years the patient has history of intermittent pain in the right upper quadrant.  This lasts anywhere from a few days to a few weeks.  She attributed it to gas, indigestion, pulled muscles. Patient was seen for routine office visit with primary care 3 days ago.  She was doing well at that time and labs were obtained.  LFTs were normal. Late Monday night/early Tuesday morning she developed an episode of pain in the right upper quadrant with radiation to the back and bilious nausea and vomiting.  This persisted.  She came to the emergency department yesterday morning.  She spent yesterday through 830 this morning in the ED and was moved to a floor bed afterwards.  She received a single dose of fentanyl and Zofran after which her symptoms resolved and have not recurred.  She has not required further pain or nausea meds.  Labs over the past 4 days show the following trends T bili 0.6 >> 3.1 >> 6.5.  Alk phos 76 >> 109 >>74.  AST/ALT 22/32 >> 222/177 >>94/152 Renal fx, electrolytes WNL.   Hgb 11.2.  Platelets 149. Ultrasound: fatty liver.  CBD dilated  up to 64m.  Cholelithiasis.     MRCP: cholelithiasis. GB distended.  Pericholecystic edema. Intr/extra hepatic ducts dilated with mid and distal CBD filling defects.     Received zosyn.  On Flagyl and Rocephin.   Patient does not drink alcohol.  She also does not take NSAIDs or aspirin. Family history notable for colon cancer in her father.  Past Medical History:  Diagnosis Date  . Allergy   . Arthritis   . Asthma   . Choledocholithiasis   . Emphysema of lung ([facility]   . Neuromuscular disorder ([facility] [place]   . Osteoporosis   . Wears dentures    full upper    Past Surgical History:  Procedure Laterality Date  . APPENDECTOMY    . BREAST EXCISIONAL BIOPSY    . CATARACT EXTRACTION W/PHACO Left 06/16/2016   Procedure: CATARACT EXTRACTION PHACO AND INTRAOCULAR LENS PLACEMENT ([facility]  Left;  Surgeon: BEulogio Bear MD;  [facility]  Service: Ophthalmology;  Laterality: Left;  Left eye IVA Topical  . CATARACT EXTRACTION W/PHACO Right 07/07/2016   Procedure: CATARACT EXTRACTION PHACO AND INTRAOCULAR LENS PLACEMENT ([facility]  Right;  Surgeon: BEulogio Bear MD;  [facility];  Service: Ophthalmology;  Laterality: Right;  . COLON SURGERY    . SMALL INTESTINE SURGERY      Prior to Admission medications   Medication Sig Start Date End Date Taking? Authorizing Provider  Adalimumab (HUMIRA) 40 MG/0.8ML PSKT Inject 40 mg into the skin every 14 (fourteen) days.    Yes [provider]  albuterol (PROVENTIL) (2.5 MG/3ML) 0.083% nebulizer solution Take 3 mLs (2.5 mg total) by nebulization every 6 (six) hours as needed for wheezing or shortness of breath. 10/11/17  Yes Hoyt Koch, MD  COD LIVER OIL PO Take 1 capsule by mouth daily.    Yes [provider]  folic acid (FOLVITE) 1 MG tablet TAKE 1 TABLET BY MOUTH EVERY DAY 02/11/17  Yes Hoyt Koch, MD  HYDROcodone-acetaminophen (NORCO/VICODIN) 5-325 MG tablet Take 1-2 tablets by mouth  every 4 (four) hours as needed for moderate pain or severe pain.   Yes [provider]  methotrexate (RHEUMATREX) 2.5 MG tablet Take 5-7.5 mg by mouth See admin instructions. Take 5-7.5 mg by mouth two times a week- 5 mg on Sundays and 7.5 mg on Saturdays   Yes [provider]  pantoprazole (PROTONIX) 40 MG tablet TAKE 1 TABLET BY MOUTH EVERY DAY 10/12/17  Yes Hoyt Koch, MD  traMADol (ULTRAM-ER) 100 MG 24 hr tablet Take 1 tablet (100 mg total) by mouth daily. Patient taking differently: Take 100 mg by mouth daily as needed for pain.  11/08/17  Yes Hoyt Koch, MD  umeclidinium-vilanterol (ANORO ELLIPTA) 62.5-25 MCG/INH AEPB Inhale 1 puff into the lungs daily. 11/08/17  Yes Hoyt Koch, MD  predniSONE (DELTASONE) 2.5 MG tablet Take 2.5 mg by mouth daily with breakfast.    [provider]    Scheduled Meds:  Infusions: . sodium chloride 75 mL/hr at 11/10/17 0304  . cefTRIAXone (ROCEPHIN)  IV    . metronidazole Stopped (11/10/17 0400)  . ondansetron (ZOFRAN) IV     PRN Meds: albuterol, fentaNYL (SUBLIMAZE) injection, ondansetron (ZOFRAN) IV, senna-docusate   Allergies as of 11/09/2017 - Review Complete 11/09/2017  Allergen Reaction Noted  . Vit d-vit e-safflower oil Shortness Of Breath and Nausea And Vomiting 11/09/2017  . Codeine Rash 05/01/2015    Family History  Problem Relation Age of Onset  . Arthritis Mother   . Hypertension Mother   . Arthritis Father   . Cancer Father        colon  . Hypertension Father     Social History   Socioeconomic History  . Marital status: Divorced    Spouse name: Not on file  . Number of children: Not on file  . Years of education: Not on file  . Highest education level: Not on file  Occupational History  . Not on file  Social Needs  . Financial resource strain: Not hard at all  . Food insecurity:    Worry: Never true    Inability: Never true  . Transportation needs:    Medical:  No    Non-medical: No  Tobacco Use  . Smoking status: Former Smoker    Last attempt to quit: 05/01/2007    Years since quitting: 10.5  . Smokeless tobacco: Current User    Types: Snuff  Substance and Sexual Activity  . Alcohol use: No    Alcohol/week: 0.0 oz  . Drug use: No  . Sexual activity: Never  Lifestyle  . Physical activity:    Days per week: 6 days  Minutes per session: 40 min  . Stress: Not at all  Relationships  . Social connections:    Talks on phone: More than three times a week    Gets together: More than three times a week    Attends religious service: Not on file    Active member of club or organization: Not on file    Attends meetings of clubs or organizations: Not on file    Relationship status: Divorced  . Intimate partner violence:    Fear of current or ex partner: Not on file    Emotionally abused: Not on file    Physically abused: Not on file    Forced sexual activity: Not on file  Other Topics Concern  . Not on file  Social History Narrative  . Not on file    REVIEW OF SYSTEMS: Constitutional: Generally she does not suffer from fatigue or weakness.  She is ambulatory can climb stairs. ENT:  No nose bleeds Pulm: No shortness of breath or cough. CV:  No palpitations, no LE edema.  Chest pain. GU: Within the last 24 hours her urine has become quite dark.  No hematuria, no frequency GI: Generally does not suffer from intestinal troubles other than when she has had these intermittent bouts of pain on the left.  She eats well.  Weight is stable.  No dysphagia.  No constipation, no diarrhea. Heme: No excessive or unusual bleeding/bruising.  Transfusions: No previous transfusions. Neuro:  No headaches, no peripheral tingling or numbness Derm:  No itching, no rash or sores.  Endocrine:  No sweats or chills.  No polyuria or dysuria Immunization: She is up-to-date on flu, Pneumovax. Travel:  None beyond local counties in last few months.    PHYSICAL  EXAM: Vital signs in last 24 hours: Vitals:   11/10/17 0500 11/10/17 0600  BP: (!) 111/48 112/72  Pulse: 69 72  Resp: 17 17  Temp:    SpO2: 93% 96%   Wt Readings from Last 3 Encounters:  11/08/17 160 lb (72.6 kg)  10/11/17 159 lb (72.1 kg)  10/08/17 160 lb (72.6 kg)    General: Pleasant, rosy cheeked not obviously jaundiced elderly WF.  Comfortable.  Does not look toxic or ill. Head: No facial asymmetry or swelling.  No signs of head trauma. Eyes: Mild scleral icterus.  No conjunctival pallor.  EOMI. Ears: Not hard of hearing. Nose: No discharge Mouth: Edentulous.  Oral mucosa is moist, pink, clear.   Neck: No thyromegaly, JVD, masses. Lungs: Clear bilaterally.  No cough or labored breathing. Heart: RRR.  No MRG.  S1, S2 present. Abdomen: Soft, active bowel sounds.  Minor tenderness in the right upper quadrant..  No masses, no HSM.Marland Kitchen   Rectal: Deferred Musc/Skeltl: Rheumatic deviation in the fingers. Extremities: No CCE. Neurologic: Alert.  Oriented x3.  Moves all limbs.  No tremors.  Limb strength not tested.  No gross neurologic deficits. Skin: No obvious jaundice.  No suspicious lesions, sores, rashes. Tattoos: None Nodes: No cervical adenopathy. Psych: Pleasant, cooperative.  Intake/Output from previous day: 06/18 0701 - 06/19 0700 In: 250 [IV Piggyback:250] Out: -  Intake/Output this shift: No intake/output data recorded.  LAB RESULTS: Recent Labs    11/09/17 1101 11/10/17 0629  WBC 9.6 9.3  HGB 13.2 11.2*  HCT 41.5 34.5*  PLT 178 149*   BMET Lab Results  Component Value Date   NA 140 11/10/2017   NA 142 11/09/2017   NA 138 11/08/2017   K 3.6 11/10/2017  K 4.0 11/09/2017   K 3.8 11/08/2017   CL 111 11/10/2017   CL 107 11/09/2017   CL 104 11/08/2017   CO2 23 11/10/2017   CO2 26 11/09/2017   CO2 26 11/08/2017   GLUCOSE 118 (H) 11/10/2017   GLUCOSE 177 (H) 11/09/2017   GLUCOSE 115 (H) 11/08/2017   BUN 16 11/10/2017   BUN 20 11/09/2017   BUN 26  (H) 11/08/2017   CREATININE 0.92 11/10/2017   CREATININE 1.01 (H) 11/09/2017   CREATININE 0.92 11/08/2017   CALCIUM 8.6 (L) 11/10/2017   CALCIUM 9.7 11/09/2017   CALCIUM 9.3 11/08/2017   LFT Recent Labs    11/08/17 1632 11/09/17 1101 11/10/17 0629  PROT 6.5 7.0 5.7*  ALBUMIN 4.1 3.9 3.1*  AST 22 222* 94*  ALT 32 177* 152*  ALKPHOS 76 109 74  BILITOT 0.6 3.1* 6.5*   PT/INR No results found for: INR, PROTIME Hepatitis Panel No results for input(s): HEPBSAG, HCVAB, HEPAIGM, HEPBIGM in the last 72 hours. C-Diff No components found for: CDIFF Lipase     Component Value Date/Time   LIPASE 37 11/09/2017 1101    Drugs of Abuse  No results found for: LABOPIA, COCAINSCRNUR, LABBENZ, AMPHETMU, THCU, LABBARB   RADIOLOGY STUDIES: Dg Chest 2 View  Result Date: 11/09/2017 CLINICAL DATA:  Chest pain.  Shortness of breath. EXAM: CHEST - 2 VIEW COMPARISON:  10/08/2017. FINDINGS: Mediastinum hilar structures normal. Low lung volumes with mild bibasilar atelectasis/infiltrates. No pleural effusion or pneumothorax. Biapical pleural thickening noted consistent scarring. No acute bony abnormality. IMPRESSION: Low lung volumes with mild bibasilar atelectasis/infiltrates. Electronically Signed   By: Marcello Moores  Register   On: 11/09/2017 13:51   US Abdomen Complete  Result Date: 11/09/2017 CLINICAL DATA:  72 year old female with acute abdominal pain. EXAM: ABDOMEN ULTRASOUND COMPLETE COMPARISON:  None. FINDINGS: Gallbladder: Positive for flat oval nonshadowing gallstones in the fundus (image 22) estimated up to 11 millimeters. Gallbladder wall thickness remains normal. Gallbladder size is at the upper limits of normal, but no pericholecystic fluid and no sonographic Murphy sign are noted. Common bile duct: Diameter: 14 millimeters diameter, severely dilated (image 34, normal up to 6 millimeters diameter). Liver: Mildly to moderately increased liver echogenicity (image 41). No intrahepatic biliary ductal  dilatation is evident. No discrete liver lesion. Portal vein is patent on color Doppler imaging with normal direction of blood flow towards the liver. IVC: No abnormality visualized. Pancreas: Visualized portion unremarkable. Spleen: Size and appearance within normal limits. Right Kidney: Length: 9.7 centimeters. Echogenicity within normal limits. No mass or hydronephrosis visualized. Left Kidney: Length: 7.1 centimeters, asymmetrically smaller. Echogenicity within normal limits. No mass or hydronephrosis visualized. Abdominal aorta: Incompletely visualized due to overlying bowel gas, visualized portions within normal limits. Other findings: None. IMPRESSION: 1. Severely dilated common bile duct, up to 14 mm. Superimposed cholelithiasis measuring about 11 mm. This constellation is suspicious for Acute Choledocholithiasis. 2. No evidence of acute cholecystitis. 3. Hepatic steatosis. Electronically Signed   By: Genevie Ann M.D.   On: 11/09/2017 15:13   Mr 3d Recon At Scanner  Result Date: 11/09/2017 CLINICAL DATA:  Abnormal liver functions. Worsening abdominal pain, nausea, and vomiting. Abdominal ultrasound demonstrated choledocholithiasis. EXAM: MRI ABDOMEN WITHOUT AND WITH CONTRAST (INCLUDING MRCP) TECHNIQUE: Multiplanar multisequence MR imaging of the abdomen was performed both before and after the administration of intravenous contrast. Heavily T2-weighted images of the biliary and pancreatic ducts were obtained, and three-dimensional MRCP images were rendered by post processing. CONTRAST:  47m MULTIHANCE GADOBENATE DIMEGLUMINE  529 MG/ML IV SOLN COMPARISON:  Ultrasound abdomen 11/09/2017 FINDINGS: Motion artifact limits examination. Lower chest: Lung bases are clear. Hepatobiliary: Liver demonstrates a few tiny subcentimeter T2 hyperintense lesions consistent with small cyst. No solid focal lesions identified. No enhancing liver lesions identified. The gallbladder is mildly distended with small stones in the  gallbladder neck. There is mild pericholecystic edema. Changes suggest mild acute cholecystitis. There is mild intra and moderate extrahepatic bile duct dilatation with common bile duct measuring up to about 14 mm in diameter. Focal filling defects are demonstrated in the mid and distal common bile duct consistent with common duct stones. Pancreas: No mass, inflammatory changes, or other parenchymal abnormality identified. Spleen:  Within normal limits in size and appearance. Adrenals/Urinary Tract: Left adrenal gland nodule measuring 2.4 cm maximal dimension. There is significant signal loss within the lesion between in and out of phase images consistent with fat content. Appearance is consistent with a benign adenoma. Subcentimeter parenchymal cysts on the left kidney. No hydronephrosis or hydroureter. Stomach/Bowel: Visualized stomach, small bowel, and colon demonstrate no evidence of dilatation, wall thickening, or inflammatory change. Vascular/Lymphatic: Normal caliber abdominal aorta. No aneurysm. Inferior vena cava is patent. No significant lymphadenopathy. Other: No free air or free fluid demonstrated in the visualized abdomen. Musculoskeletal: No suspicious bone lesions identified. IMPRESSION: 1. Distended gallbladder with cholelithiasis and mild pericholecystic edema suggesting acute cholecystitis. 2. Dilated intra and extrahepatic bile ducts with filling defects in the mid and distal common bile duct consistent with choledocholithiasis. 3. Left adrenal gland nodule has imaging characteristics consistent with benign adenoma. 4. Small subcentimeter cysts in the liver and left kidney. Electronically Signed   By: Lucienne Capers M.D.   On: 11/09/2017 22:00   Mr Abdomen Mrcp Moise Boring Contast  Result Date: 11/09/2017 CLINICAL DATA:  Abnormal liver functions. Worsening abdominal pain, nausea, and vomiting. Abdominal ultrasound demonstrated choledocholithiasis. EXAM: MRI ABDOMEN WITHOUT AND WITH CONTRAST  (INCLUDING MRCP) TECHNIQUE: Multiplanar multisequence MR imaging of the abdomen was performed both before and after the administration of intravenous contrast. Heavily T2-weighted images of the biliary and pancreatic ducts were obtained, and three-dimensional MRCP images were rendered by post processing. CONTRAST:  19m MULTIHANCE GADOBENATE DIMEGLUMINE 529 MG/ML IV SOLN COMPARISON:  Ultrasound abdomen 11/09/2017 FINDINGS: Motion artifact limits examination. Lower chest: Lung bases are clear. Hepatobiliary: Liver demonstrates a few tiny subcentimeter T2 hyperintense lesions consistent with small cyst. No solid focal lesions identified. No enhancing liver lesions identified. The gallbladder is mildly distended with small stones in the gallbladder neck. There is mild pericholecystic edema. Changes suggest mild acute cholecystitis. There is mild intra and moderate extrahepatic bile duct dilatation with common bile duct measuring up to about 14 mm in diameter. Focal filling defects are demonstrated in the mid and distal common bile duct consistent with common duct stones. Pancreas: No mass, inflammatory changes, or other parenchymal abnormality identified. Spleen:  Within normal limits in size and appearance. Adrenals/Urinary Tract: Left adrenal gland nodule measuring 2.4 cm maximal dimension. There is significant signal loss within the lesion between in and out of phase images consistent with fat content. Appearance is consistent with a benign adenoma. Subcentimeter parenchymal cysts on the left kidney. No hydronephrosis or hydroureter. Stomach/Bowel: Visualized stomach, small bowel, and colon demonstrate no evidence of dilatation, wall thickening, or inflammatory change. Vascular/Lymphatic: Normal caliber abdominal aorta. No aneurysm. Inferior vena cava is patent. No significant lymphadenopathy. Other: No free air or free fluid demonstrated in the visualized abdomen. Musculoskeletal: No suspicious bone lesions  identified. IMPRESSION: 1. Distended gallbladder with cholelithiasis and mild pericholecystic edema suggesting acute cholecystitis. 2. Dilated intra and extrahepatic bile ducts with filling defects in the mid and distal common bile duct consistent with choledocholithiasis. 3. Left adrenal gland nodule has imaging characteristics consistent with benign adenoma. 4. Small subcentimeter cysts in the liver and left kidney. Electronically Signed   By: Lucienne Capers M.D.   On: 11/09/2017 22:00    IMPRESSION:   *    Choledocholithiasis.  *    Cholelithiasis.  *    History large, obstructing colon polyp of unclear pathology which required partial colectomy ~ 2011.  Has not had colonoscopy since then  *     Rheumatoid arthritis.  On methotrexate and Humira.  Daughter asked if it would be okay for patient to get her Humira dose which was due yesterday.  I discussed this with Dr. Brantley Stage, the surgeon, he prefers that she not get this as it will impair wound healing.      PLAN:     *   Working on ERCP (Endoscopicretrogradecholangiopancreatography) for this afternoon around 3 PM.  If we cannot do it today, she will be set up for tomorrow.  In the meantime keep her n.p.o., continue antibiotics. Risks, benefits, alternatives of ERCP (Endoscopicretrogradecholangiopancreatography) discussed with patient and her daughter.  Azucena Freed  11/10/2017, 8:09 AM Phone [phone]     Attending physician's note   I have taken a history, examined the patient and reviewed the chart. I agree with the Advanced Practitioner's note, impression and recommendations.  Cholelithiasis, probably cholecystitis, choledocholithiasis. MRCP report and images reviewed: Cholelithiasis. GB distended.  Pericholecystic edema. Intra/extra hepatic ductal dilation with mid and distal CBD filling defects. Elevated AST/ALT.  NPO, IV antibiotics Schedule ERCP, possible sphincterotomy. The risks (including pancreatitis, bleeding,  perforation, infection, missed lesions, medication reactions and possible prolonged hospitalization or surgery if complications occur), benefits, and alternatives to endoscopy with possible biopsy and possible dilation were discussed with the patient and they consent to proceed.  Outpatient colonoscopy for polyp follow up in several weeks.    Lucio Edward, MD FACG [phone] office

## 2017-11-10 NOTE — Plan of Care (Signed)
  Problem: Pain Managment: Goal: General experience of comfort will improve Outcome: Progressing   Problem: Safety: Goal: Ability to remain free from injury will improve Outcome: Progressing   

## 2017-11-10 NOTE — Progress Notes (Signed)
PROGRESS NOTE    Sandra Schwartz  IRJ:[id number] DOB: 1946/04/05 DOA: 11/09/2017 PCP: Hoyt Koch, MD   Brief Narrative:  Sandra Schwartz is Sandra Schwartz very pleasant 72 y.o. female with medical history significant for hypertension, GERD, rheumatoid arthritis, who presented to ED with nausea, vomiting, abdominal pain and was found to have cholecystitis and choledocholithiasis.    GI and surgery following.   Assessment & Plan:   Principal Problem:   Choledocholithiasis Active Problems:   COPD (chronic obstructive pulmonary disease) (HCC)   Hyperlipidemia   Rheumatoid arthritis ([place])   Essential hypertension   Elevated transaminase level   #1. Choledocholithiasis  Cholecystitis:  [place] with dilated CBD, up to 14 mm with cholelithiasis as well.  MRCP with distended gallbladder with cholelithiasis and mild pericholecystic edema suggesting acute cholecystitis.  MRCP also  Showed findings c/w choledocholithiasis.  AST/ALT improved today, but tbili rising to 6.5 today.   - IVF - ceftriaxone/flagyl - NPO - IV analgesia and antiemetics - Appreciate GI and surgery recommendations   #2. Elevated Liver Enzymes: as noted above, AST/ALT improving, but rising bili.  2/2 above, continue to monitor.   # 3. Hypertension. Not on any home meds.   # 4 Rheumatoid arthritis. Appears stable and at baseline.  She tells me she's no longer taking prednisone.  Only methotrexate, holding this for now.    #5. COPD. Stable at baseline. Chest x-ray as noted above -continue home meds  Adrenal nodule c/w adenoma on imaging Subcentimeter cysts in liver and kidney  DVT prophylaxis: SCD Code Status: full  Family Communication: none at bedside Disposition Plan: pending GI/surgery evaluation   Consultants:   GI ([place])  surgery  Procedures:   none  Antimicrobials:  Anti-infectives (From admission, onward)   Start     Dose/Rate Route Frequency Ordered Stop   11/10/17 0730  cefTRIAXone (ROCEPHIN) 2  g in sodium chloride 0.9 % 100 mL IVPB     2 g 200 mL/hr over 30 Minutes Intravenous Every 24 hours 11/10/17 0727     11/09/17 1800  metroNIDAZOLE (FLAGYL) IVPB 500 mg     500 mg 100 mL/hr over 60 Minutes Intravenous Every 8 hours 11/09/17 1721     11/09/17 1745  piperacillin-tazobactam (ZOSYN) IVPB 3.375 g     3.375 g 100 mL/hr over 30 Minutes Intravenous  Once 11/09/17 1740 11/09/17 1856      Subjective: No pain at this time, currently resolved. Pain occurred all of Sandra Schwartz sudden yesterday AM.   Stabbing RUQ pain.    Objective: Vitals:   11/10/17 0345 11/10/17 0400 11/10/17 0500 11/10/17 0600  BP:  (!) 97/48 (!) 111/48 112/72  Pulse: 66 69 69 72  Resp:  16 17 17   Temp:      TempSrc:      SpO2: 93% 93% 93% 96%    Intake/Output Summary (Last 24 hours) at 11/10/2017 0736 Last data filed at 11/10/2017 0400 Gross per 24 hour  Intake 250 ml  Output -  Net 250 ml   There were no vitals filed for this visit.  Examination:  General exam: Appears calm and comfortable  Respiratory system: Clear to auscultation. Respiratory effort normal. Cardiovascular system: S1 & S2 heard, RRR. No JVD, murmurs, rubs, gallops or clicks. No pedal edema. Gastrointestinal system: Abdomen is nondistended, soft and TTP in epigastric and RUQ. Quiet bowel sounds. Central nervous system: Alert and oriented. No focal neurological deficits. Extremities: Symmetric 5 x 5 power. Skin: No rashes, lesions or ulcers Psychiatry: Judgement  and insight appear normal. Mood & affect appropriate.     Data Reviewed: I have personally reviewed following labs and imaging studies  CBC: Recent Labs  Lab 11/09/17 1101 11/10/17 0629  WBC 9.6 9.3  HGB 13.2 11.2*  HCT 41.5 34.5*  MCV 93.7 93.2  PLT 178 412*   Basic Metabolic Panel: Recent Labs  Lab 11/08/17 1632 11/09/17 1101 11/10/17 0629  NA 138 142 140  K 3.8 4.0 3.6  CL 104 107 111  CO2 26 26 23   GLUCOSE 115* 177* 118*  BUN 26* 20 16  CREATININE 0.92  1.01* 0.92  CALCIUM 9.3 9.7 8.6*   GFR: Estimated Creatinine Clearance: 49.6 mL/min (by C-G formula based on SCr of 0.92 mg/dL). Liver Function Tests: Recent Labs  Lab 11/08/17 1632 11/09/17 1101 11/10/17 0629  AST 22 222* 94*  ALT 32 177* 152*  ALKPHOS 76 109 74  BILITOT 0.6 3.1* 6.5*  PROT 6.5 7.0 5.7*  ALBUMIN 4.1 3.9 3.1*   Recent Labs  Lab 11/09/17 1101  LIPASE 37   No results for input(s): AMMONIA in the last 168 hours. Coagulation Profile: No results for input(s): INR, PROTIME in the last 168 hours. Cardiac Enzymes: No results for input(s): CKTOTAL, CKMB, CKMBINDEX, TROPONINI in the last 168 hours. BNP (last 3 results) No results for input(s): PROBNP in the last 8760 hours. HbA1C: Recent Labs    11/08/17 1632  HGBA1C 5.4   CBG: No results for input(s): GLUCAP in the last 168 hours. Lipid Profile: Recent Labs    11/08/17 1632  CHOL 253*  HDL 65.20  LDLCALC 152*  TRIG 182.0*  CHOLHDL 4   Thyroid Function Tests: No results for input(s): TSH, T4TOTAL, FREET4, T3FREE, THYROIDAB in the last 72 hours. Anemia Panel: No results for input(s): VITAMINB12, FOLATE, FERRITIN, TIBC, IRON, RETICCTPCT in the last 72 hours. Sepsis Labs: No results for input(s): PROCALCITON, LATICACIDVEN in the last 168 hours.  No results found for this or any previous visit (from the past 240 hour(s)).       Radiology Studies: Dg Chest 2 View  Result Date: 11/09/2017 CLINICAL DATA:  Chest pain.  Shortness of breath. EXAM: CHEST - 2 VIEW COMPARISON:  10/08/2017. FINDINGS: Mediastinum hilar structures normal. Low lung volumes with mild bibasilar atelectasis/infiltrates. No pleural effusion or pneumothorax. Biapical pleural thickening noted consistent scarring. No acute bony abnormality. IMPRESSION: Low lung volumes with mild bibasilar atelectasis/infiltrates. Electronically Signed   By: Marcello Moores  Register   On: 11/09/2017 13:51   US Abdomen Complete  Result Date:  11/09/2017 CLINICAL DATA:  72 year old female with acute abdominal pain. EXAM: ABDOMEN ULTRASOUND COMPLETE COMPARISON:  None. FINDINGS: Gallbladder: Positive for flat oval nonshadowing gallstones in the fundus (image 22) estimated up to 11 millimeters. Gallbladder wall thickness remains normal. Gallbladder size is at the upper limits of normal, but no pericholecystic fluid and no sonographic Murphy sign are noted. Common bile duct: Diameter: 14 millimeters diameter, severely dilated (image 34, normal up to 6 millimeters diameter). Liver: Mildly to moderately increased liver echogenicity (image 41). No intrahepatic biliary ductal dilatation is evident. No discrete liver lesion. Portal vein is patent on color Doppler imaging with normal direction of blood flow towards the liver. IVC: No abnormality visualized. Pancreas: Visualized portion unremarkable. Spleen: Size and appearance within normal limits. Right Kidney: Length: 9.7 centimeters. Echogenicity within normal limits. No mass or hydronephrosis visualized. Left Kidney: Length: 7.1 centimeters, asymmetrically smaller. Echogenicity within normal limits. No mass or hydronephrosis visualized. Abdominal aorta: Incompletely visualized due  to overlying bowel gas, visualized portions within normal limits. Other findings: None. IMPRESSION: 1. Severely dilated common bile duct, up to 14 mm. Superimposed cholelithiasis measuring about 11 mm. This constellation is suspicious for Acute Choledocholithiasis. 2. No evidence of acute cholecystitis. 3. Hepatic steatosis. Electronically Signed   By: Genevie Ann M.D.   On: 11/09/2017 15:13   Mr 3d Recon At Scanner  Result Date: 11/09/2017 CLINICAL DATA:  Abnormal liver functions. Worsening abdominal pain, nausea, and vomiting. Abdominal ultrasound demonstrated choledocholithiasis. EXAM: MRI ABDOMEN WITHOUT AND WITH CONTRAST (INCLUDING MRCP) TECHNIQUE: Multiplanar multisequence MR imaging of the abdomen was performed both before and  after the administration of intravenous contrast. Heavily T2-weighted images of the biliary and pancreatic ducts were obtained, and three-dimensional MRCP images were rendered by post processing. CONTRAST:  66mL MULTIHANCE GADOBENATE DIMEGLUMINE 529 MG/ML IV SOLN COMPARISON:  Ultrasound abdomen 11/09/2017 FINDINGS: Motion artifact limits examination. Lower chest: Lung bases are clear. Hepatobiliary: Liver demonstrates Sandra Schwartz few tiny subcentimeter T2 hyperintense lesions consistent with small cyst. No solid focal lesions identified. No enhancing liver lesions identified. The gallbladder is mildly distended with small stones in the gallbladder neck. There is mild pericholecystic edema. Changes suggest mild acute cholecystitis. There is mild intra and moderate extrahepatic bile duct dilatation with common bile duct measuring up to about 14 mm in diameter. Focal filling defects are demonstrated in the mid and distal common bile duct consistent with common duct stones. Pancreas: No mass, inflammatory changes, or other parenchymal abnormality identified. Spleen:  Within normal limits in size and appearance. Adrenals/Urinary Tract: Left adrenal gland nodule measuring 2.4 cm maximal dimension. There is significant signal loss within the lesion between in and out of phase images consistent with fat content. Appearance is consistent with Sandra Schwartz benign adenoma. Subcentimeter parenchymal cysts on the left kidney. No hydronephrosis or hydroureter. Stomach/Bowel: Visualized stomach, small bowel, and colon demonstrate no evidence of dilatation, wall thickening, or inflammatory change. Vascular/Lymphatic: Normal caliber abdominal aorta. No aneurysm. Inferior vena cava is patent. No significant lymphadenopathy. Other: No free air or free fluid demonstrated in the visualized abdomen. Musculoskeletal: No suspicious bone lesions identified. IMPRESSION: 1. Distended gallbladder with cholelithiasis and mild pericholecystic edema suggesting acute  cholecystitis. 2. Dilated intra and extrahepatic bile ducts with filling defects in the mid and distal common bile duct consistent with choledocholithiasis. 3. Left adrenal gland nodule has imaging characteristics consistent with benign adenoma. 4. Small subcentimeter cysts in the liver and left kidney. Electronically Signed   By: Sandra Schwartz M.D.   On: 11/09/2017 22:00   Mr Abdomen Mrcp Moise Boring Contast  Result Date: 11/09/2017 CLINICAL DATA:  Abnormal liver functions. Worsening abdominal pain, nausea, and vomiting. Abdominal ultrasound demonstrated choledocholithiasis. EXAM: MRI ABDOMEN WITHOUT AND WITH CONTRAST (INCLUDING MRCP) TECHNIQUE: Multiplanar multisequence MR imaging of the abdomen was performed both before and after the administration of intravenous contrast. Heavily T2-weighted images of the biliary and pancreatic ducts were obtained, and three-dimensional MRCP images were rendered by post processing. CONTRAST:  52mL MULTIHANCE GADOBENATE DIMEGLUMINE 529 MG/ML IV SOLN COMPARISON:  Ultrasound abdomen 11/09/2017 FINDINGS: Motion artifact limits examination. Lower chest: Lung bases are clear. Hepatobiliary: Liver demonstrates Sandra Schwartz few tiny subcentimeter T2 hyperintense lesions consistent with small cyst. No solid focal lesions identified. No enhancing liver lesions identified. The gallbladder is mildly distended with small stones in the gallbladder neck. There is mild pericholecystic edema. Changes suggest mild acute cholecystitis. There is mild intra and moderate extrahepatic bile duct dilatation with common bile duct measuring up to  about 14 mm in diameter. Focal filling defects are demonstrated in the mid and distal common bile duct consistent with common duct stones. Pancreas: No mass, inflammatory changes, or other parenchymal abnormality identified. Spleen:  Within normal limits in size and appearance. Adrenals/Urinary Tract: Left adrenal gland nodule measuring 2.4 cm maximal dimension. There is  significant signal loss within the lesion between in and out of phase images consistent with fat content. Appearance is consistent with Sandra Schwartz benign adenoma. Subcentimeter parenchymal cysts on the left kidney. No hydronephrosis or hydroureter. Stomach/Bowel: Visualized stomach, small bowel, and colon demonstrate no evidence of dilatation, wall thickening, or inflammatory change. Vascular/Lymphatic: Normal caliber abdominal aorta. No aneurysm. Inferior vena cava is patent. No significant lymphadenopathy. Other: No free air or free fluid demonstrated in the visualized abdomen. Musculoskeletal: No suspicious bone lesions identified. IMPRESSION: 1. Distended gallbladder with cholelithiasis and mild pericholecystic edema suggesting acute cholecystitis. 2. Dilated intra and extrahepatic bile ducts with filling defects in the mid and distal common bile duct consistent with choledocholithiasis. 3. Left adrenal gland nodule has imaging characteristics consistent with benign adenoma. 4. Small subcentimeter cysts in the liver and left kidney. Electronically Signed   By: Sandra Schwartz M.D.   On: 11/09/2017 22:00        Scheduled Meds: Continuous Infusions: . sodium chloride 75 mL/hr at 11/10/17 0304  . cefTRIAXone (ROCEPHIN)  IV    . metronidazole Stopped (11/10/17 0400)  . ondansetron (ZOFRAN) IV       LOS: 1 day    Time spent: over 66 min    Fayrene Helper, MD [facility] Pager [phone]  If 7PM-7AM, please contact night-coverage [email or web address] Password [facility] 11/10/2017, 7:36 AM

## 2017-11-10 NOTE — Progress Notes (Signed)
Unfortunately not able to accommodate ERCP onto the schedule today.  Her ERCP is rescheduled for 1 PM tomorrow.  Allow clears.  I will inform the patient and her daughter.  S Ervan Heber PA-C.

## 2017-11-10 NOTE — Progress Notes (Signed)
Patient ID: Sandra Schwartz, female   DOB: [birth year], 72 y.o.   MRN: [id number]       Subjective: Pt feels ok this morning.  Still getting some pain medicine, and states her pain is better and controlled.  No nausea or vomiting.  Objective: Vital signs in last 24 hours: Temp:  [97.9 F (36.6 C)] 97.9 F (36.6 C) (06/18 1058) Pulse Rate:  [66-80] 72 (06/19 0600) Resp:  [16-19] 17 (06/19 0600) BP: (96-137)/(44-72) 112/72 (06/19 0600) SpO2:  [91 %-100 %] 96 % (06/19 0600)    Intake/Output from previous day: 06/18 0701 - 06/19 0700 In: 250 [IV Piggyback:250] Out: -  Intake/Output this shift: No intake/output data recorded.  PE: Heart: regular Lungs: CTAB Abd: soft, mildly tender in RUQ and epigastrium, ND, +BS  Lab Results:  Recent Labs    11/09/17 1101 11/10/17 0629  WBC 9.6 9.3  HGB 13.2 11.2*  HCT 41.5 34.5*  PLT 178 149*   BMET Recent Labs    11/09/17 1101 11/10/17 0629  NA 142 140  K 4.0 3.6  CL 107 111  CO2 26 23  GLUCOSE 177* 118*  BUN 20 16  CREATININE 1.01* 0.92  CALCIUM 9.7 8.6*   PT/INR No results for input(s): LABPROT, INR in the last 72 hours. CMP     Component Value Date/Time   NA 140 11/10/2017 0629   K 3.6 11/10/2017 0629   CL 111 11/10/2017 0629   CO2 23 11/10/2017 0629   GLUCOSE 118 (H) 11/10/2017 0629   BUN 16 11/10/2017 0629   CREATININE 0.92 11/10/2017 0629   CREATININE 0.73 05/01/2015 1536   CALCIUM 8.6 (L) 11/10/2017 0629   PROT 5.7 (L) 11/10/2017 0629   ALBUMIN 3.1 (L) 11/10/2017 0629   AST 94 (H) 11/10/2017 0629   ALT 152 (H) 11/10/2017 0629   ALKPHOS 74 11/10/2017 0629   BILITOT 6.5 (H) 11/10/2017 0629   GFRNONAA >60 11/10/2017 0629   GFRNONAA 84 05/01/2015 1536   GFRAA >60 11/10/2017 0629   GFRAA >89 05/01/2015 1536   Lipase     Component Value Date/Time   LIPASE 37 11/09/2017 1101       Studies/Results: Dg Chest 2 View  Result Date: 11/09/2017 CLINICAL DATA:  Chest pain.  Shortness of breath. EXAM: CHEST -  2 VIEW COMPARISON:  10/08/2017. FINDINGS: Mediastinum hilar structures normal. Low lung volumes with mild bibasilar atelectasis/infiltrates. No pleural effusion or pneumothorax. Biapical pleural thickening noted consistent scarring. No acute bony abnormality. IMPRESSION: Low lung volumes with mild bibasilar atelectasis/infiltrates. Electronically Signed   By: Marcello Moores  Register   On: 11/09/2017 13:51   US Abdomen Complete  Result Date: 11/09/2017 CLINICAL DATA:  72 year old female with acute abdominal pain. EXAM: ABDOMEN ULTRASOUND COMPLETE COMPARISON:  None. FINDINGS: Gallbladder: Positive for flat oval nonshadowing gallstones in the fundus (image 22) estimated up to 11 millimeters. Gallbladder wall thickness remains normal. Gallbladder size is at the upper limits of normal, but no pericholecystic fluid and no sonographic Murphy sign are noted. Common bile duct: Diameter: 14 millimeters diameter, severely dilated (image 34, normal up to 6 millimeters diameter). Liver: Mildly to moderately increased liver echogenicity (image 41). No intrahepatic biliary ductal dilatation is evident. No discrete liver lesion. Portal vein is patent on color Doppler imaging with normal direction of blood flow towards the liver. IVC: No abnormality visualized. Pancreas: Visualized portion unremarkable. Spleen: Size and appearance within normal limits. Right Kidney: Length: 9.7 centimeters. Echogenicity within normal limits. No mass or hydronephrosis visualized. Left  Kidney: Length: 7.1 centimeters, asymmetrically smaller. Echogenicity within normal limits. No mass or hydronephrosis visualized. Abdominal aorta: Incompletely visualized due to overlying bowel gas, visualized portions within normal limits. Other findings: None. IMPRESSION: 1. Severely dilated common bile duct, up to 14 mm. Superimposed cholelithiasis measuring about 11 mm. This constellation is suspicious for Acute Choledocholithiasis. 2. No evidence of acute  cholecystitis. 3. Hepatic steatosis. Electronically Signed   By: Genevie Ann M.D.   On: 11/09/2017 15:13   Mr 3d Recon At Scanner  Result Date: 11/09/2017 CLINICAL DATA:  Abnormal liver functions. Worsening abdominal pain, nausea, and vomiting. Abdominal ultrasound demonstrated choledocholithiasis. EXAM: MRI ABDOMEN WITHOUT AND WITH CONTRAST (INCLUDING MRCP) TECHNIQUE: Multiplanar multisequence MR imaging of the abdomen was performed both before and after the administration of intravenous contrast. Heavily T2-weighted images of the biliary and pancreatic ducts were obtained, and three-dimensional MRCP images were rendered by post processing. CONTRAST:  53mL MULTIHANCE GADOBENATE DIMEGLUMINE 529 MG/ML IV SOLN COMPARISON:  Ultrasound abdomen 11/09/2017 FINDINGS: Motion artifact limits examination. Lower chest: Lung bases are clear. Hepatobiliary: Liver demonstrates a few tiny subcentimeter T2 hyperintense lesions consistent with small cyst. No solid focal lesions identified. No enhancing liver lesions identified. The gallbladder is mildly distended with small stones in the gallbladder neck. There is mild pericholecystic edema. Changes suggest mild acute cholecystitis. There is mild intra and moderate extrahepatic bile duct dilatation with common bile duct measuring up to about 14 mm in diameter. Focal filling defects are demonstrated in the mid and distal common bile duct consistent with common duct stones. Pancreas: No mass, inflammatory changes, or other parenchymal abnormality identified. Spleen:  Within normal limits in size and appearance. Adrenals/Urinary Tract: Left adrenal gland nodule measuring 2.4 cm maximal dimension. There is significant signal loss within the lesion between in and out of phase images consistent with fat content. Appearance is consistent with a benign adenoma. Subcentimeter parenchymal cysts on the left kidney. No hydronephrosis or hydroureter. Stomach/Bowel: Visualized stomach, small  bowel, and colon demonstrate no evidence of dilatation, wall thickening, or inflammatory change. Vascular/Lymphatic: Normal caliber abdominal aorta. No aneurysm. Inferior vena cava is patent. No significant lymphadenopathy. Other: No free air or free fluid demonstrated in the visualized abdomen. Musculoskeletal: No suspicious bone lesions identified. IMPRESSION: 1. Distended gallbladder with cholelithiasis and mild pericholecystic edema suggesting acute cholecystitis. 2. Dilated intra and extrahepatic bile ducts with filling defects in the mid and distal common bile duct consistent with choledocholithiasis. 3. Left adrenal gland nodule has imaging characteristics consistent with benign adenoma. 4. Small subcentimeter cysts in the liver and left kidney. Electronically Signed   By: Lucienne Capers M.D.   On: 11/09/2017 22:00   Mr Abdomen Mrcp Moise Boring Contast  Result Date: 11/09/2017 CLINICAL DATA:  Abnormal liver functions. Worsening abdominal pain, nausea, and vomiting. Abdominal ultrasound demonstrated choledocholithiasis. EXAM: MRI ABDOMEN WITHOUT AND WITH CONTRAST (INCLUDING MRCP) TECHNIQUE: Multiplanar multisequence MR imaging of the abdomen was performed both before and after the administration of intravenous contrast. Heavily T2-weighted images of the biliary and pancreatic ducts were obtained, and three-dimensional MRCP images were rendered by post processing. CONTRAST:  24mL MULTIHANCE GADOBENATE DIMEGLUMINE 529 MG/ML IV SOLN COMPARISON:  Ultrasound abdomen 11/09/2017 FINDINGS: Motion artifact limits examination. Lower chest: Lung bases are clear. Hepatobiliary: Liver demonstrates a few tiny subcentimeter T2 hyperintense lesions consistent with small cyst. No solid focal lesions identified. No enhancing liver lesions identified. The gallbladder is mildly distended with small stones in the gallbladder neck. There is mild pericholecystic edema. Changes suggest  mild acute cholecystitis. There is mild intra and  moderate extrahepatic bile duct dilatation with common bile duct measuring up to about 14 mm in diameter. Focal filling defects are demonstrated in the mid and distal common bile duct consistent with common duct stones. Pancreas: No mass, inflammatory changes, or other parenchymal abnormality identified. Spleen:  Within normal limits in size and appearance. Adrenals/Urinary Tract: Left adrenal gland nodule measuring 2.4 cm maximal dimension. There is significant signal loss within the lesion between in and out of phase images consistent with fat content. Appearance is consistent with a benign adenoma. Subcentimeter parenchymal cysts on the left kidney. No hydronephrosis or hydroureter. Stomach/Bowel: Visualized stomach, small bowel, and colon demonstrate no evidence of dilatation, wall thickening, or inflammatory change. Vascular/Lymphatic: Normal caliber abdominal aorta. No aneurysm. Inferior vena cava is patent. No significant lymphadenopathy. Other: No free air or free fluid demonstrated in the visualized abdomen. Musculoskeletal: No suspicious bone lesions identified. IMPRESSION: 1. Distended gallbladder with cholelithiasis and mild pericholecystic edema suggesting acute cholecystitis. 2. Dilated intra and extrahepatic bile ducts with filling defects in the mid and distal common bile duct consistent with choledocholithiasis. 3. Left adrenal gland nodule has imaging characteristics consistent with benign adenoma. 4. Small subcentimeter cysts in the liver and left kidney. Electronically Signed   By: Lucienne Capers M.D.   On: 11/09/2017 22:00    Anti-infectives: Anti-infectives (From admission, onward)   Start     Dose/Rate Route Frequency Ordered Stop   11/10/17 0730  cefTRIAXone (ROCEPHIN) 2 g in sodium chloride 0.9 % 100 mL IVPB     2 g 200 mL/hr over 30 Minutes Intravenous Every 24 hours 11/10/17 0727     11/09/17 1800  metroNIDAZOLE (FLAGYL) IVPB 500 mg     500 mg 100 mL/hr over 60 Minutes  Intravenous Every 8 hours 11/09/17 1721     11/09/17 1745  piperacillin-tazobactam (ZOSYN) IVPB 3.375 g     3.375 g 100 mL/hr over 30 Minutes Intravenous  Once 11/09/17 1740 11/09/17 1856       Assessment/Plan Choledocholithiasis -MRCP shows choledocholithiasis.  Awaiting GI consult for ERCP.  TB up to 6.5 today from 3.1 yesterday -plan for lap chole day after ERCP -will follow.  RA HTN COPD  FEN - NPO for possible procedure today VTE - SCDs ID - zosyn x 1 dose then DC.  On Rocephin/Flagyl.  Doubt flagyl is needed for biliary issues.  Rocephin should be adequate for this.   LOS: 1 day    Henreitta Cea , [facility] 11/10/2017, 8:17 AM Pager: [phone]

## 2017-11-11 ENCOUNTER — Inpatient Hospital Stay (HOSPITAL_COMMUNITY): Payer: Medicare Other | Admitting: Critical Care Medicine

## 2017-11-11 ENCOUNTER — Encounter (HOSPITAL_COMMUNITY): Payer: Self-pay | Admitting: Critical Care Medicine

## 2017-11-11 ENCOUNTER — Inpatient Hospital Stay (HOSPITAL_COMMUNITY): Payer: Medicare Other

## 2017-11-11 ENCOUNTER — Encounter (HOSPITAL_COMMUNITY)
Admission: EM | Disposition: A | Discharge status: Home / Self Care | Payer: Self-pay | Source: Home / Self Care | Attending: Family Medicine

## 2017-11-11 DIAGNOSIS — K8051 Calculus of bile duct without cholangitis or cholecystitis with obstruction: Secondary | ICD-10-CM

## 2017-11-11 DIAGNOSIS — R945 Abnormal results of liver function studies: Secondary | ICD-10-CM

## 2017-11-11 HISTORY — PX: ENDOSCOPIC RETROGRADE CHOLANGIOPANCREATOGRAPHY (ERCP) WITH PROPOFOL: SHX5810

## 2017-11-11 HISTORY — PX: SPHINCTEROTOMY: SHX5544

## 2017-11-11 HISTORY — PX: REMOVAL OF STONES: SHX5545

## 2017-11-11 LAB — HEPATIC FUNCTION PANEL
ALBUMIN: 2.7 g/dL — AB (ref 3.5–5.0)
ALT: 104 U/L — ABNORMAL HIGH (ref 14–54)
AST: 56 U/L — AB (ref 15–41)
Alkaline Phosphatase: 81 U/L (ref 38–126)
BILIRUBIN DIRECT: 1.7 mg/dL — AB (ref 0.1–0.5)
Indirect Bilirubin: 2.6 mg/dL — ABNORMAL HIGH (ref 0.3–0.9)
Total Bilirubin: 4.3 mg/dL — ABNORMAL HIGH (ref 0.3–1.2)
Total Protein: 5.4 g/dL — ABNORMAL LOW (ref 6.5–8.1)

## 2017-11-11 LAB — BASIC METABOLIC PANEL
ANION GAP: 6 (ref 5–15)
BUN: 10 mg/dL (ref 6–20)
CALCIUM: 8.3 mg/dL — AB (ref 8.9–10.3)
CO2: 24 mmol/L (ref 22–32)
CREATININE: 0.83 mg/dL (ref 0.44–1.00)
Chloride: 110 mmol/L (ref 101–111)
GFR calc non Af Amer: >60 mL/min (ref >60)
Glucose, Bld: 108 mg/dL — ABNORMAL HIGH (ref 65–99)
Potassium: 3.9 mmol/L (ref 3.5–5.1)
SODIUM: 140 mmol/L (ref 135–145)

## 2017-11-11 LAB — CBC
HCT: 32.9 % — ABNORMAL LOW (ref 36.0–46.0)
Hemoglobin: 10.3 g/dL — ABNORMAL LOW (ref 12.0–15.0)
MCH: 29.3 pg (ref 26.0–34.0)
MCHC: 31.3 g/dL (ref 30.0–36.0)
MCV: 93.5 fL (ref 78.0–100.0)
PLATELETS: 133 10*3/uL — AB (ref 150–400)
RBC: 3.52 MIL/uL — ABNORMAL LOW (ref 3.87–5.11)
RDW: 15.3 % (ref 11.5–15.5)
WBC: 5.5 10*3/uL (ref 4.0–10.5)

## 2017-11-11 LAB — MAGNESIUM: MAGNESIUM: 2.1 mg/dL (ref 1.7–2.4)

## 2017-11-11 LAB — SURGICAL PCR SCREEN
MRSA, PCR: NEGATIVE
Staphylococcus aureus: NEGATIVE

## 2017-11-11 SURGERY — ENDOSCOPIC RETROGRADE CHOLANGIOPANCREATOGRAPHY (ERCP) WITH PROPOFOL
Anesthesia: General

## 2017-11-11 MED ORDER — FENTANYL CITRATE (PF) 100 MCG/2ML IJ SOLN
INTRAMUSCULAR | Status: DC | PRN
  Administered 2017-11-11: 50 ug via INTRAVENOUS

## 2017-11-11 MED ORDER — LACTATED RINGERS IV SOLN
INTRAVENOUS | Status: DC
  Administered 2017-11-11: 1000 mL via INTRAVENOUS

## 2017-11-11 MED ORDER — LIDOCAINE 2% (20 MG/ML) 5 ML SYRINGE
INTRAMUSCULAR | Status: DC | PRN
  Administered 2017-11-11: 100 mg via INTRAVENOUS

## 2017-11-11 MED ORDER — PROPOFOL 10 MG/ML IV BOLUS
INTRAVENOUS | Status: DC | PRN
  Administered 2017-11-11: 150 mg via INTRAVENOUS

## 2017-11-11 MED ORDER — SUGAMMADEX SODIUM 200 MG/2ML IV SOLN
INTRAVENOUS | Status: DC | PRN
  Administered 2017-11-11: 150 mg via INTRAVENOUS

## 2017-11-11 MED ORDER — SODIUM CHLORIDE 0.9 % IV SOLN
INTRAVENOUS | Status: DC | PRN
  Administered 2017-11-11: 30 mL

## 2017-11-11 MED ORDER — SODIUM CHLORIDE 0.9 % IV SOLN
INTRAVENOUS | Status: DC
  Administered 2017-11-11 – 2017-11-12 (×5): via INTRAVENOUS

## 2017-11-11 MED ORDER — GLUCAGON HCL RDNA (DIAGNOSTIC) 1 MG IJ SOLR
INTRAMUSCULAR | Status: DC | PRN
  Administered 2017-11-11 (×2): 0.25 mg via INTRAVENOUS

## 2017-11-11 MED ORDER — IOPAMIDOL (ISOVUE-300) INJECTION 61%
INTRAVENOUS | Status: AC
  Filled 2017-11-11: qty 50

## 2017-11-11 MED ORDER — GLUCAGON HCL RDNA (DIAGNOSTIC) 1 MG IJ SOLR
INTRAMUSCULAR | Status: AC
  Filled 2017-11-11: qty 1

## 2017-11-11 MED ORDER — ONDANSETRON HCL 4 MG/2ML IJ SOLN
INTRAMUSCULAR | Status: DC | PRN
  Administered 2017-11-11: 4 mg via INTRAVENOUS

## 2017-11-11 MED ORDER — DEXAMETHASONE SODIUM PHOSPHATE 10 MG/ML IJ SOLN
INTRAMUSCULAR | Status: DC | PRN
  Administered 2017-11-11: 10 mg via INTRAVENOUS

## 2017-11-11 MED ORDER — INDOMETHACIN 50 MG RE SUPP
RECTAL | Status: DC | PRN
  Administered 2017-11-11: 100 mg via RECTAL

## 2017-11-11 MED ORDER — INDOMETHACIN 50 MG RE SUPP
RECTAL | Status: AC
  Filled 2017-11-11: qty 1

## 2017-11-11 MED ORDER — INDOMETHACIN 50 MG RE SUPP: 100.0000 mg | Freq: Once | RECTAL | Status: DC

## 2017-11-11 MED ORDER — PHENYLEPHRINE 40 MCG/ML (10ML) SYRINGE FOR IV PUSH (FOR BLOOD PRESSURE SUPPORT)
PREFILLED_SYRINGE | INTRAVENOUS | Status: DC | PRN
  Administered 2017-11-11 (×2): 80 ug via INTRAVENOUS
  Administered 2017-11-11: 40 ug via INTRAVENOUS

## 2017-11-11 MED ORDER — ROCURONIUM BROMIDE 10 MG/ML (PF) SYRINGE
PREFILLED_SYRINGE | INTRAVENOUS | Status: DC | PRN
  Administered 2017-11-11: 50 mg via INTRAVENOUS

## 2017-11-11 NOTE — Progress Notes (Signed)
Back from Endo via wheelchair. Alert, oriented

## 2017-11-11 NOTE — Anesthesia Procedure Notes (Signed)
Procedure Name: Intubation Date/Time: 11/11/2017 11:47 AM Performed by: Wilburn Cornelia, CRNA Pre-anesthesia Checklist: Patient identified, Emergency Drugs available, Suction available, Patient being monitored and Timeout performed Patient Re-evaluated:Patient Re-evaluated prior to induction Oxygen Delivery Method: Circle system utilized Preoxygenation: Pre-oxygenation with 100% oxygen Induction Type: IV induction Ventilation: Mask ventilation without difficulty Laryngoscope Size: Mac and 3 Grade View: Grade I Tube type: Oral Tube size: 7.0 mm Number of attempts: 1 Airway Equipment and Method: Stylet Placement Confirmation: ETT inserted through vocal cords under direct vision,  CO2 detector,  positive ETCO2 and breath sounds checked- equal and bilateral Secured at: 21 cm Tube secured with: Tape Dental Injury: Teeth and Oropharynx as per pre-operative assessment

## 2017-11-11 NOTE — Anesthesia Postprocedure Evaluation (Signed)
Anesthesia Post Note  Patient: Sandra Schwartz  Procedure(s) Performed: ENDOSCOPIC RETROGRADE CHOLANGIOPANCREATOGRAPHY (ERCP) WITH PROPOFOL (N/A ) SPHINCTEROTOMY REMOVAL OF STONES     Patient location during evaluation: PACU Anesthesia Type: General Level of consciousness: awake and alert Pain management: pain level controlled Vital Signs Assessment: post-procedure vital signs reviewed and stable Respiratory status: spontaneous breathing, nonlabored ventilation, respiratory function stable and patient connected to nasal cannula oxygen Cardiovascular status: blood pressure returned to baseline and stable Postop Assessment: no apparent nausea or vomiting Anesthetic complications: no    Last Vitals:  Vitals:   11/11/17 1310 11/11/17 1320  BP: (!) 157/67 (!) 167/65  Pulse: 61 (!) 59  Resp: (!) 21 (!) 25  Temp:    SpO2: 98% 99%    Last Pain:  Vitals:   11/11/17 1243  TempSrc: Oral  PainSc: 0-No pain                 Montez Hageman

## 2017-11-11 NOTE — Progress Notes (Signed)
To Endo unit via wheelchair for scheduled ERCP

## 2017-11-11 NOTE — Op Note (Addendum)
[facility] Patient Name: Sandra Schwartz Procedure Date : 11/11/2017 MRN: [id number] Attending MD: Ladene Artist , MD Date of Birth: 10/23/1945 CSN: [id number] Age: 72 Admit Type: Inpatient Procedure:                ERCP Indications:              Bile duct stone(s) Providers:                Pricilla Riffle. Fuller Plan, MD, Cleda Daub, RN, Cherylynn Ridges, Technician, Merrilyn Puma, CRNA Referring MD:             [facility] Medicines:                General Anesthesia Complications:            No immediate complications. Estimated Blood Loss:     Estimated blood loss was minimal. Procedure:                Pre-Anesthesia Assessment:                           - Prior to the procedure, a History and Physical                            was performed, and patient medications and                            allergies were reviewed. The patient's tolerance of                            previous anesthesia was also reviewed. The risks                            and benefits of the procedure and the sedation                            options and risks were discussed with the patient.                            All questions were answered, and informed consent                            was obtained. Prior Anticoagulants: The patient has                            taken no previous anticoagulant or antiplatelet                            agents. ASA Grade Assessment: III - A patient with                            severe systemic disease. After reviewing the risks  and benefits, the patient was deemed in                            satisfactory condition to undergo the procedure.                           After obtaining informed consent, the scope was                            passed under direct vision. Throughout the                            procedure, the patient's blood pressure, pulse, and                            oxygen  saturations were monitored continuously. The                            [id number]) was introduced through the                            mouth, and used to inject contrast into and used to                            inject contrast into the bile duct. The ERCP was                            accomplished without difficulty. The patient                            tolerated the procedure well. Scope In: Scope Out: Findings:      The scout film was normal. The esophagus was successfully intubated       under direct vision. The scope was advanced to a normal major papilla in       the descending duodenum without detailed examination of the pharynx,       larynx and associated structures, and upper GI tract. The upper GI tract       was grossly normal except for a periampullary diverticulum and a hood       over the ampulla. A straight Roadrunner wire was passed into the biliary       tree. The short-nosed traction sphincterotome was passed over the       guidewire and the bile duct was then deeply cannulated. Contrast was       injected. I personally interpreted the bile duct images. There was       appropriate flow of contrast through the ducts. The common bile duct was       diffusely dilated, with stones causing an obstruction. The largest       diameter was 14 mm. The left main hepatic duct and right main hepatic       duct were diffusely dilated, with stones causing an obstruction. The       common bile duct contained 3 filling defects thought to be stones and       sludge. A 10 mm biliary sphincterotomy was made with a  traction       (standard) sphincterotome using ERBE electrocautery. There was no       post-sphincterotomy bleeding. The biliary tree was swept several times       with a 15 mm balloon starting at the bifurcation. Sludge was swept from       the duct. Three stones were removed from the duct. No stones or sludge       remained. The final occlusion cholangiogram  showed no filling defects.       Excellent biliary drainage was then noted. The PD was not cannulated or       injected by intention. Impression:               - Filling defects consistent with stones and sludge                            were noted on the cholangiogram.                           - The common bile duct was dilated, with stones,                            sludge causing an obstruction.                           - The left main hepatic duct and right main hepatic                            duct were dilated, with stones, sludge causing an                            obstruction.                           - Choledocholithiasis was found. Complete removal                            was accomplished by biliary sphincterotomy and                            balloon extraction.                           - A biliary sphincterotomy was performed.                           - The biliary tree was swept.                           - Periampullary diverticulum. Recommendation:           - Avoid aspirin and nonsteroidal anti-inflammatory                            medicines for 1 week.                           - Return patient to hospital ward for ongoing  care.                           - Trend LFTs. Procedure Code(s):        --- Professional ---                           [phone], Endoscopic retrograde                            cholangiopancreatography (ERCP); with removal of                            calculi/debris from biliary/pancreatic duct(s)                           43262, Endoscopic retrograde                            cholangiopancreatography (ERCP); with                            sphincterotomy/papillotomy Diagnosis Code(s):        --- Professional ---                           K80.51, Calculus of bile duct without cholangitis                            or cholecystitis with obstruction                           R93.2, Abnormal findings on diagnostic imaging of                             liver and biliary tract CPT copyright 2017 American [employer] Association. All rights reserved. The codes documented in this report are preliminary and upon coder review may  be revised to meet current compliance requirements. Ladene Artist, MD 11/11/2017 12:35:01 PM This report has been signed electronically. Number of Addenda: 0

## 2017-11-11 NOTE — Progress Notes (Signed)
PROGRESS NOTE    Sandra Schwartz  SWF:[id number] DOB: 1945-08-25 DOA: 11/09/2017 PCP: Hoyt Koch, MD   Brief Narrative:  Sandra Schwartz is Sandra Schwartz very pleasant 72 y.o. female with medical history significant for hypertension, GERD, rheumatoid arthritis, who presented to ED with nausea, vomiting, abdominal pain and was found to have cholecystitis and choledocholithiasis.    GI and surgery following.   Assessment & Plan:   Principal Problem:   Choledocholithiasis Active Problems:   COPD (chronic obstructive pulmonary disease) (HCC)   Hyperlipidemia   Rheumatoid arthritis ([place])   Essential hypertension   Elevated transaminase level   #1. Choledocholithiasis  Cholecystitis:  [place] with dilated CBD, up to 14 mm with cholelithiasis as well.  MRCP with distended gallbladder with cholelithiasis and mild pericholecystic edema suggesting acute cholecystitis.  MRCP also showed findings c/w choledocholithiasis.  AST/ALT improved today, tbili improving. - IVF - ceftriaxone/flagyl - NPO - IV analgesia and antiemetics - Appreciate GI and surgery recommendations - ERCP today, likely lap chole tomorrow  #2. Elevated Liver Enzymes: as noted above, AST/ALT improving, as well as bilirubin.  2/2 above, continue to monitor.   # 3. Hypertension. Not on any home meds.   # 4 Rheumatoid arthritis. Appears stable and at baseline.  She tells me she's no longer taking prednisone.  Only methotrexate, holding this for now.    #5. COPD. Stable at baseline. Chest x-ray as noted above -continue home meds  # Thrombocytopenia: follow, mild # Anemia: follow, mild, likely some hemodilution  Adrenal nodule c/w adenoma on imaging Subcentimeter cysts in liver and kidney  DVT prophylaxis: SCD Code Status: full  Family Communication: none at bedside Disposition Plan: pending GI/surgery evaluation   Consultants:   GI ([place])  surgery  Procedures:   none  Antimicrobials:  Anti-infectives (From  admission, onward)   Start     Dose/Rate Route Frequency Ordered Stop   11/10/17 0730  cefTRIAXone (ROCEPHIN) 2 g in sodium chloride 0.9 % 100 mL IVPB     2 g 200 mL/hr over 30 Minutes Intravenous Every 24 hours 11/10/17 0727     11/09/17 1800  metroNIDAZOLE (FLAGYL) IVPB 500 mg     500 mg 100 mL/hr over 60 Minutes Intravenous Every 8 hours 11/09/17 1721     11/09/17 1745  piperacillin-tazobactam (ZOSYN) IVPB 3.375 g     3.375 g 100 mL/hr over 30 Minutes Intravenous  Once 11/09/17 1740 11/09/17 1856      Subjective: No pain. Still some soreness with palpation.   Objective: Vitals:   11/10/17 1949 11/10/17 2144 11/11/17 0455 11/11/17 0614  BP:  125/62 135/71   Pulse:  75 72   Resp:  17 16   Temp:  99.4 F (37.4 C) 99.7 F (37.6 C)   TempSrc:  Oral Oral   SpO2:  91% 92%   Weight: 72.6 kg (160 lb)   75.8 kg (167 lb 1.7 oz)  Height: 5\' 1"  (1.549 m)       Intake/Output Summary (Last 24 hours) at 11/11/2017 1027 Last data filed at 11/11/2017 1001 Gross per 24 hour  Intake 3306.2 ml  Output -  Net 3306.2 ml   Filed Weights   11/10/17 1949 11/11/17 0614  Weight: 72.6 kg (160 lb) 75.8 kg (167 lb 1.7 oz)    Examination:  General: No acute distress. Cardiovascular: Heart sounds show Keaisha Sublette regular rate, and rhythm. No gallops or rubs. No murmurs. No JVD. Lungs: Clear to auscultation bilaterally with good air movement. No rales,  rhonchi or wheezes. Abdomen: Soft, ttp in epigastric and RUQ, mildly distended Neurological: Alert and oriented 3. Moves all extremities 4. Cranial nerves II through XII grossly intact. Skin: Warm and dry. No rashes or lesions. Extremities: No clubbing or cyanosis. No edema.  Psychiatric: Mood and affect are normal. Insight and judgment are appropriate.   Data Reviewed: I have personally reviewed following labs and imaging studies  CBC: Recent Labs  Lab 11/09/17 1101 11/10/17 0629 11/11/17 0659  WBC 9.6 9.3 5.5  HGB 13.2 11.2* 10.3*  HCT 41.5  34.5* 32.9*  MCV 93.7 93.2 93.5  PLT 178 149* 989*   Basic Metabolic Panel: Recent Labs  Lab 11/08/17 1632 11/09/17 1101 11/10/17 0629 11/11/17 0659  NA 138 142 140 140  K 3.8 4.0 3.6 3.9  CL 104 107 111 110  CO2 26 26 23 24   GLUCOSE 115* 177* 118* 108*  BUN 26* 20 16 10   CREATININE 0.92 1.01* 0.92 0.83  CALCIUM 9.3 9.7 8.6* 8.3*  MG  --   --   --  2.1   GFR: Estimated Creatinine Clearance: 56.2 mL/min (by C-G formula based on SCr of 0.83 mg/dL). Liver Function Tests: Recent Labs  Lab 11/08/17 1632 11/09/17 1101 11/10/17 0629 11/11/17 0659  AST 22 222* 94* 56*  ALT 32 177* 152* 104*  ALKPHOS 76 109 74 81  BILITOT 0.6 3.1* 6.5* 4.3*  PROT 6.5 7.0 5.7* 5.4*  ALBUMIN 4.1 3.9 3.1* 2.7*   Recent Labs  Lab 11/09/17 1101  LIPASE 37   No results for input(s): AMMONIA in the last 168 hours. Coagulation Profile: No results for input(s): INR, PROTIME in the last 168 hours. Cardiac Enzymes: No results for input(s): CKTOTAL, CKMB, CKMBINDEX, TROPONINI in the last 168 hours. BNP (last 3 results) No results for input(s): PROBNP in the last 8760 hours. HbA1C: Recent Labs    11/08/17 1632  HGBA1C 5.4   CBG: No results for input(s): GLUCAP in the last 168 hours. Lipid Profile: Recent Labs    11/08/17 1632  CHOL 253*  HDL 65.20  LDLCALC 152*  TRIG 182.0*  CHOLHDL 4   Thyroid Function Tests: No results for input(s): TSH, T4TOTAL, FREET4, T3FREE, THYROIDAB in the last 72 hours. Anemia Panel: No results for input(s): VITAMINB12, FOLATE, FERRITIN, TIBC, IRON, RETICCTPCT in the last 72 hours. Sepsis Labs: No results for input(s): PROCALCITON, LATICACIDVEN in the last 168 hours.  No results found for this or any previous visit (from the past 240 hour(s)).       Radiology Studies: Dg Chest 2 View  Result Date: 11/09/2017 CLINICAL DATA:  Chest pain.  Shortness of breath. EXAM: CHEST - 2 VIEW COMPARISON:  10/08/2017. FINDINGS: Mediastinum hilar structures  normal. Low lung volumes with mild bibasilar atelectasis/infiltrates. No pleural effusion or pneumothorax. Biapical pleural thickening noted consistent scarring. No acute bony abnormality. IMPRESSION: Low lung volumes with mild bibasilar atelectasis/infiltrates. Electronically Signed   By: Marcello Moores  Register   On: 11/09/2017 13:51   US Abdomen Complete  Result Date: 11/09/2017 CLINICAL DATA:  72 year old female with acute abdominal pain. EXAM: ABDOMEN ULTRASOUND COMPLETE COMPARISON:  None. FINDINGS: Gallbladder: Positive for flat oval nonshadowing gallstones in the fundus (image 22) estimated up to 11 millimeters. Gallbladder wall thickness remains normal. Gallbladder size is at the upper limits of normal, but no pericholecystic fluid and no sonographic Murphy sign are noted. Common bile duct: Diameter: 14 millimeters diameter, severely dilated (image 34, normal up to 6 millimeters diameter). Liver: Mildly to moderately increased  liver echogenicity (image 41). No intrahepatic biliary ductal dilatation is evident. No discrete liver lesion. Portal vein is patent on color Doppler imaging with normal direction of blood flow towards the liver. IVC: No abnormality visualized. Pancreas: Visualized portion unremarkable. Spleen: Size and appearance within normal limits. Right Kidney: Length: 9.7 centimeters. Echogenicity within normal limits. No mass or hydronephrosis visualized. Left Kidney: Length: 7.1 centimeters, asymmetrically smaller. Echogenicity within normal limits. No mass or hydronephrosis visualized. Abdominal aorta: Incompletely visualized due to overlying bowel gas, visualized portions within normal limits. Other findings: None. IMPRESSION: 1. Severely dilated common bile duct, up to 14 mm. Superimposed cholelithiasis measuring about 11 mm. This constellation is suspicious for Acute Choledocholithiasis. 2. No evidence of acute cholecystitis. 3. Hepatic steatosis. Electronically Signed   By: Genevie Ann M.D.   On:  11/09/2017 15:13   Mr 3d Recon At Scanner  Result Date: 11/09/2017 CLINICAL DATA:  Abnormal liver functions. Worsening abdominal pain, nausea, and vomiting. Abdominal ultrasound demonstrated choledocholithiasis. EXAM: MRI ABDOMEN WITHOUT AND WITH CONTRAST (INCLUDING MRCP) TECHNIQUE: Multiplanar multisequence MR imaging of the abdomen was performed both before and after the administration of intravenous contrast. Heavily T2-weighted images of the biliary and pancreatic ducts were obtained, and three-dimensional MRCP images were rendered by post processing. CONTRAST:  71mL MULTIHANCE GADOBENATE DIMEGLUMINE 529 MG/ML IV SOLN COMPARISON:  Ultrasound abdomen 11/09/2017 FINDINGS: Motion artifact limits examination. Lower chest: Lung bases are clear. Hepatobiliary: Liver demonstrates Nicholi Ghuman few tiny subcentimeter T2 hyperintense lesions consistent with small cyst. No solid focal lesions identified. No enhancing liver lesions identified. The gallbladder is mildly distended with small stones in the gallbladder neck. There is mild pericholecystic edema. Changes suggest mild acute cholecystitis. There is mild intra and moderate extrahepatic bile duct dilatation with common bile duct measuring up to about 14 mm in diameter. Focal filling defects are demonstrated in the mid and distal common bile duct consistent with common duct stones. Pancreas: No mass, inflammatory changes, or other parenchymal abnormality identified. Spleen:  Within normal limits in size and appearance. Adrenals/Urinary Tract: Left adrenal gland nodule measuring 2.4 cm maximal dimension. There is significant signal loss within the lesion between in and out of phase images consistent with fat content. Appearance is consistent with Ruger Saxer benign adenoma. Subcentimeter parenchymal cysts on the left kidney. No hydronephrosis or hydroureter. Stomach/Bowel: Visualized stomach, small bowel, and colon demonstrate no evidence of dilatation, wall thickening, or inflammatory  change. Vascular/Lymphatic: Normal caliber abdominal aorta. No aneurysm. Inferior vena cava is patent. No significant lymphadenopathy. Other: No free air or free fluid demonstrated in the visualized abdomen. Musculoskeletal: No suspicious bone lesions identified. IMPRESSION: 1. Distended gallbladder with cholelithiasis and mild pericholecystic edema suggesting acute cholecystitis. 2. Dilated intra and extrahepatic bile ducts with filling defects in the mid and distal common bile duct consistent with choledocholithiasis. 3. Left adrenal gland nodule has imaging characteristics consistent with benign adenoma. 4. Small subcentimeter cysts in the liver and left kidney. Electronically Signed   By: Lucienne Capers M.D.   On: 11/09/2017 22:00   Mr Abdomen Mrcp Moise Boring Contast  Result Date: 11/09/2017 CLINICAL DATA:  Abnormal liver functions. Worsening abdominal pain, nausea, and vomiting. Abdominal ultrasound demonstrated choledocholithiasis. EXAM: MRI ABDOMEN WITHOUT AND WITH CONTRAST (INCLUDING MRCP) TECHNIQUE: Multiplanar multisequence MR imaging of the abdomen was performed both before and after the administration of intravenous contrast. Heavily T2-weighted images of the biliary and pancreatic ducts were obtained, and three-dimensional MRCP images were rendered by post processing. CONTRAST:  13mL MULTIHANCE GADOBENATE DIMEGLUMINE 529  MG/ML IV SOLN COMPARISON:  Ultrasound abdomen 11/09/2017 FINDINGS: Motion artifact limits examination. Lower chest: Lung bases are clear. Hepatobiliary: Liver demonstrates Baltazar Pekala few tiny subcentimeter T2 hyperintense lesions consistent with small cyst. No solid focal lesions identified. No enhancing liver lesions identified. The gallbladder is mildly distended with small stones in the gallbladder neck. There is mild pericholecystic edema. Changes suggest mild acute cholecystitis. There is mild intra and moderate extrahepatic bile duct dilatation with common bile duct measuring up to about  14 mm in diameter. Focal filling defects are demonstrated in the mid and distal common bile duct consistent with common duct stones. Pancreas: No mass, inflammatory changes, or other parenchymal abnormality identified. Spleen:  Within normal limits in size and appearance. Adrenals/Urinary Tract: Left adrenal gland nodule measuring 2.4 cm maximal dimension. There is significant signal loss within the lesion between in and out of phase images consistent with fat content. Appearance is consistent with Jaine Estabrooks benign adenoma. Subcentimeter parenchymal cysts on the left kidney. No hydronephrosis or hydroureter. Stomach/Bowel: Visualized stomach, small bowel, and colon demonstrate no evidence of dilatation, wall thickening, or inflammatory change. Vascular/Lymphatic: Normal caliber abdominal aorta. No aneurysm. Inferior vena cava is patent. No significant lymphadenopathy. Other: No free air or free fluid demonstrated in the visualized abdomen. Musculoskeletal: No suspicious bone lesions identified. IMPRESSION: 1. Distended gallbladder with cholelithiasis and mild pericholecystic edema suggesting acute cholecystitis. 2. Dilated intra and extrahepatic bile ducts with filling defects in the mid and distal common bile duct consistent with choledocholithiasis. 3. Left adrenal gland nodule has imaging characteristics consistent with benign adenoma. 4. Small subcentimeter cysts in the liver and left kidney. Electronically Signed   By: Lucienne Capers M.D.   On: 11/09/2017 22:00        Scheduled Meds: Continuous Infusions: . sodium chloride 75 mL/hr at 11/11/17 1001  . cefTRIAXone (ROCEPHIN)  IV 2 g (11/11/17 0758)  . metronidazole 500 mg (11/11/17 1001)     LOS: 2 days    Time spent: over 30 min    Fayrene Helper, MD [facility] Pager [phone]  If 7PM-7AM, please contact night-coverage [email or web address] Password [facility] 11/11/2017, 10:27 AM

## 2017-11-11 NOTE — Consult Note (Signed)
            [facility] CM Primary Care Navigator  11/11/2017  Sandra Schwartz [birth year] [id number]   Went to see patientat the bedsideto identify possible discharge needs  butstaffreports that patient is off the unit for a procedure in Endo.(ERCP- Endoscopic Retrograde Cholangiopancreatography)  Will attempt toseepatientat another timewhen available in the room.    Addendum (11/12/17):  Went back to see patient and seen daughter (Sandra Schwartz)at the bedside to identify possible discharge needs. Patient reports having "increased pain to lower abdomen, nausea/ vomiting and chills" that had led to this admission/ surgery. (choledocholithiasis underwent laparoscopic cholecystectomy)  Patient endorses Dr.Elizabeth Crawford with [facility] [employer] [facility] as herprimary care provider.   [facility] [address] [facility].  Patientmentioned that daughter is managing medications for her at [facility] use of "pill box" system filled every week.  Patientreportsthatdaughter or son in-law Nicki Reaper) was driving and providing  transportation to herdoctors'appointments.  Patient lives at home with daughter and son in-law who serve as the primary caregivers for her.  Anticipated discharge plan ishomewhenready per daughter.  Patientand daughter voiced understanding to call primary care provider's officewhenshereturnshomefor a post discharge follow-upvisitwithin1- 2 weeksor sooner if needs arise.Patient letter (with PCP's contact number) was provided asareminder.  Discussed with patientand daughter regarding [facility] CM services available for health managementand resourcesat home, but verbalizedhaving nocurrentneeds or concernsat this point.Daughter has been assisting patient in managing her health needs at home and patient voiced awareness in managing COPD with inhalers and breathing treatments  as well as follow-up with providers. Reviewed COPD action plan with teachback method.   Patientand daughtervoiced understanding of needto seekreferral from primary care provider to [facility] care management if deemednecessary and appropriate for anyservices in thefuture.  [facility] care management information was provided for future needs thatpatientmay have.  Patienthad opted and verbally agreedwith EMMI calls to monitor withrecovery at home.  Referral made for EMMIGeneralcalls after discharge.   For additional questions please contact:  Edwena Felty A. Taccara Bushnell, BSN, RN-BC [facility] PRIMARY CARE Navigator Cell: [phone]

## 2017-11-11 NOTE — H&P (View-Only) (Signed)
Subjective/Chief Complaint: Abdominal pain Patient complains of right upper quadrant abdominal pain this morning.  She is scheduled for ERCP later today for her choledocholithiasis.  Otherwise, she has no other complaints.  The pain is mild to moderate location right upper quadrant without radiation and similar to what she came to the hospital with.  She had some nausea but no current vomiting.   Objective: Vital signs in last 24 hours: Temp:  [98.4 F (36.9 C)-99.7 F (37.6 C)] 99.7 F (37.6 C) (06/20 0455) Pulse Rate:  [70-81] 72 (06/20 0455) Resp:  [16-17] 16 (06/20 0455) BP: (120-163)/(56-71) 135/71 (06/20 0455) SpO2:  [91 %-99 %] 92 % (06/20 0455) Weight:  [72.6 kg (160 lb)-75.8 kg (167 lb 1.7 oz)] 75.8 kg (167 lb 1.7 oz) (06/20 0614) Last BM Date: 11/08/17  Intake/Output from previous day: 06/19 0701 - 06/20 0700 In: 3054 [P.O.:120; I.V.:2384; IV Piggyback:550] Out: -  Intake/Output this shift: No intake/output data recorded.  General appearance: alert and cooperative Head: Normocephalic, without obvious abnormality, atraumatic GI: Mild right upper quadrant tenderness.  No significant rebound or guarding.  Lab Results:  Recent Labs    11/09/17 1101 11/10/17 0629  WBC 9.6 9.3  HGB 13.2 11.2*  HCT 41.5 34.5*  PLT 178 149*   BMET Recent Labs    11/09/17 1101 11/10/17 0629  NA 142 140  K 4.0 3.6  CL 107 111  CO2 26 23  GLUCOSE 177* 118*  BUN 20 16  CREATININE 1.01* 0.92  CALCIUM 9.7 8.6*   PT/INR No results for input(s): LABPROT, INR in the last 72 hours. ABG No results for input(s): PHART, HCO3 in the last 72 hours.  Invalid input(s): PCO2, PO2  Studies/Results: Dg Chest 2 View  Result Date: 11/09/2017 CLINICAL DATA:  Chest pain.  Shortness of breath. EXAM: CHEST - 2 VIEW COMPARISON:  10/08/2017. FINDINGS: Mediastinum hilar structures normal. Low lung volumes with mild bibasilar atelectasis/infiltrates. No pleural effusion or pneumothorax.  Biapical pleural thickening noted consistent scarring. No acute bony abnormality. IMPRESSION: Low lung volumes with mild bibasilar atelectasis/infiltrates. Electronically Signed   By: Marcello Moores  Register   On: 11/09/2017 13:51   US Abdomen Complete  Result Date: 11/09/2017 CLINICAL DATA:  72 year old female with acute abdominal pain. EXAM: ABDOMEN ULTRASOUND COMPLETE COMPARISON:  None. FINDINGS: Gallbladder: Positive for flat oval nonshadowing gallstones in the fundus (image 22) estimated up to 11 millimeters. Gallbladder wall thickness remains normal. Gallbladder size is at the upper limits of normal, but no pericholecystic fluid and no sonographic Murphy sign are noted. Common bile duct: Diameter: 14 millimeters diameter, severely dilated (image 34, normal up to 6 millimeters diameter). Liver: Mildly to moderately increased liver echogenicity (image 41). No intrahepatic biliary ductal dilatation is evident. No discrete liver lesion. Portal vein is patent on color Doppler imaging with normal direction of blood flow towards the liver. IVC: No abnormality visualized. Pancreas: Visualized portion unremarkable. Spleen: Size and appearance within normal limits. Right Kidney: Length: 9.7 centimeters. Echogenicity within normal limits. No mass or hydronephrosis visualized. Left Kidney: Length: 7.1 centimeters, asymmetrically smaller. Echogenicity within normal limits. No mass or hydronephrosis visualized. Abdominal aorta: Incompletely visualized due to overlying bowel gas, visualized portions within normal limits. Other findings: None. IMPRESSION: 1. Severely dilated common bile duct, up to 14 mm. Superimposed cholelithiasis measuring about 11 mm. This constellation is suspicious for Acute Choledocholithiasis. 2. No evidence of acute cholecystitis. 3. Hepatic steatosis. Electronically Signed   By: Genevie Ann M.D.   On: 11/09/2017 15:13  Mr 3d Recon At Scanner  Result Date: 11/09/2017 CLINICAL DATA:  Abnormal liver  functions. Worsening abdominal pain, nausea, and vomiting. Abdominal ultrasound demonstrated choledocholithiasis. EXAM: MRI ABDOMEN WITHOUT AND WITH CONTRAST (INCLUDING MRCP) TECHNIQUE: Multiplanar multisequence MR imaging of the abdomen was performed both before and after the administration of intravenous contrast. Heavily T2-weighted images of the biliary and pancreatic ducts were obtained, and three-dimensional MRCP images were rendered by post processing. CONTRAST:  44mL MULTIHANCE GADOBENATE DIMEGLUMINE 529 MG/ML IV SOLN COMPARISON:  Ultrasound abdomen 11/09/2017 FINDINGS: Motion artifact limits examination. Lower chest: Lung bases are clear. Hepatobiliary: Liver demonstrates a few tiny subcentimeter T2 hyperintense lesions consistent with small cyst. No solid focal lesions identified. No enhancing liver lesions identified. The gallbladder is mildly distended with small stones in the gallbladder neck. There is mild pericholecystic edema. Changes suggest mild acute cholecystitis. There is mild intra and moderate extrahepatic bile duct dilatation with common bile duct measuring up to about 14 mm in diameter. Focal filling defects are demonstrated in the mid and distal common bile duct consistent with common duct stones. Pancreas: No mass, inflammatory changes, or other parenchymal abnormality identified. Spleen:  Within normal limits in size and appearance. Adrenals/Urinary Tract: Left adrenal gland nodule measuring 2.4 cm maximal dimension. There is significant signal loss within the lesion between in and out of phase images consistent with fat content. Appearance is consistent with a benign adenoma. Subcentimeter parenchymal cysts on the left kidney. No hydronephrosis or hydroureter. Stomach/Bowel: Visualized stomach, small bowel, and colon demonstrate no evidence of dilatation, wall thickening, or inflammatory change. Vascular/Lymphatic: Normal caliber abdominal aorta. No aneurysm. Inferior vena cava is  patent. No significant lymphadenopathy. Other: No free air or free fluid demonstrated in the visualized abdomen. Musculoskeletal: No suspicious bone lesions identified. IMPRESSION: 1. Distended gallbladder with cholelithiasis and mild pericholecystic edema suggesting acute cholecystitis. 2. Dilated intra and extrahepatic bile ducts with filling defects in the mid and distal common bile duct consistent with choledocholithiasis. 3. Left adrenal gland nodule has imaging characteristics consistent with benign adenoma. 4. Small subcentimeter cysts in the liver and left kidney. Electronically Signed   By: Lucienne Capers M.D.   On: 11/09/2017 22:00   Mr Abdomen Mrcp Moise Boring Contast  Result Date: 11/09/2017 CLINICAL DATA:  Abnormal liver functions. Worsening abdominal pain, nausea, and vomiting. Abdominal ultrasound demonstrated choledocholithiasis. EXAM: MRI ABDOMEN WITHOUT AND WITH CONTRAST (INCLUDING MRCP) TECHNIQUE: Multiplanar multisequence MR imaging of the abdomen was performed both before and after the administration of intravenous contrast. Heavily T2-weighted images of the biliary and pancreatic ducts were obtained, and three-dimensional MRCP images were rendered by post processing. CONTRAST:  77mL MULTIHANCE GADOBENATE DIMEGLUMINE 529 MG/ML IV SOLN COMPARISON:  Ultrasound abdomen 11/09/2017 FINDINGS: Motion artifact limits examination. Lower chest: Lung bases are clear. Hepatobiliary: Liver demonstrates a few tiny subcentimeter T2 hyperintense lesions consistent with small cyst. No solid focal lesions identified. No enhancing liver lesions identified. The gallbladder is mildly distended with small stones in the gallbladder neck. There is mild pericholecystic edema. Changes suggest mild acute cholecystitis. There is mild intra and moderate extrahepatic bile duct dilatation with common bile duct measuring up to about 14 mm in diameter. Focal filling defects are demonstrated in the mid and distal common bile duct  consistent with common duct stones. Pancreas: No mass, inflammatory changes, or other parenchymal abnormality identified. Spleen:  Within normal limits in size and appearance. Adrenals/Urinary Tract: Left adrenal gland nodule measuring 2.4 cm maximal dimension. There is significant signal loss within the  lesion between in and out of phase images consistent with fat content. Appearance is consistent with a benign adenoma. Subcentimeter parenchymal cysts on the left kidney. No hydronephrosis or hydroureter. Stomach/Bowel: Visualized stomach, small bowel, and colon demonstrate no evidence of dilatation, wall thickening, or inflammatory change. Vascular/Lymphatic: Normal caliber abdominal aorta. No aneurysm. Inferior vena cava is patent. No significant lymphadenopathy. Other: No free air or free fluid demonstrated in the visualized abdomen. Musculoskeletal: No suspicious bone lesions identified. IMPRESSION: 1. Distended gallbladder with cholelithiasis and mild pericholecystic edema suggesting acute cholecystitis. 2. Dilated intra and extrahepatic bile ducts with filling defects in the mid and distal common bile duct consistent with choledocholithiasis. 3. Left adrenal gland nodule has imaging characteristics consistent with benign adenoma. 4. Small subcentimeter cysts in the liver and left kidney. Electronically Signed   By: Lucienne Capers M.D.   On: 11/09/2017 22:00    Anti-infectives: Anti-infectives (From admission, onward)   Start     Dose/Rate Route Frequency Ordered Stop   11/10/17 0730  cefTRIAXone (ROCEPHIN) 2 g in sodium chloride 0.9 % 100 mL IVPB     2 g 200 mL/hr over 30 Minutes Intravenous Every 24 hours 11/10/17 0727     11/09/17 1800  metroNIDAZOLE (FLAGYL) IVPB 500 mg     500 mg 100 mL/hr over 60 Minutes Intravenous Every 8 hours 11/09/17 1721     11/09/17 1745  piperacillin-tazobactam (ZOSYN) IVPB 3.375 g     3.375 g 100 mL/hr over 30 Minutes Intravenous  Once 11/09/17 1740 11/09/17 1856       Assessment/Plan: Choledocholithiasis-for ERCP today  Probable chronic cholecystitis-for laparoscopic cholecystectomy tomorrow.  May have clear liquids after ERCP today.  N.p.o. after midnight   LOS: 2 days    Joyice Faster Carole Doner 11/11/2017

## 2017-11-11 NOTE — Anesthesia Preprocedure Evaluation (Signed)
Anesthesia Evaluation  Patient identified by MRN, date of birth, ID band Patient awake    Reviewed: Allergy & Precautions, NPO status , Patient's Chart, lab work & pertinent test results  Airway Mallampati: II  TM Distance: >3 FB Neck ROM: Full    Dental no notable dental hx. (+) Upper Dentures   Pulmonary asthma , COPD, former smoker,    Pulmonary exam normal breath sounds clear to auscultation       Cardiovascular negative cardio ROS Normal cardiovascular exam Rhythm:Regular Rate:Normal     Neuro/Psych negative neurological ROS  negative psych ROS   GI/Hepatic negative GI ROS, Neg liver ROS,   Endo/Other  negative endocrine ROS  Renal/GU negative Renal ROS  negative genitourinary   Musculoskeletal negative musculoskeletal ROS (+)   Abdominal   Peds negative pediatric ROS (+)  Hematology negative hematology ROS (+)   Anesthesia Other Findings   Reproductive/Obstetrics negative OB ROS                             Anesthesia Physical Anesthesia Plan  ASA: II  Anesthesia Plan: General   Post-op Pain Management:    Induction: Intravenous  PONV Risk Score and Plan: 3 and Ondansetron and Treatment may vary due to age or medical condition  Airway Management Planned: Oral ETT  Additional Equipment:   Intra-op Plan:   Post-operative Plan: Extubation in OR  Informed Consent: I have reviewed the patients History and Physical, chart, labs and discussed the procedure including the risks, benefits and alternatives for the proposed anesthesia with the patient or authorized representative who has indicated his/her understanding and acceptance.   Dental advisory given  Plan Discussed with: CRNA  Anesthesia Plan Comments:         Anesthesia Quick Evaluation

## 2017-11-11 NOTE — Progress Notes (Signed)
Subjective/Chief Complaint: Abdominal pain Patient complains of right upper quadrant abdominal pain this morning.  She is scheduled for ERCP later today for her choledocholithiasis.  Otherwise, she has no other complaints.  The pain is mild to moderate location right upper quadrant without radiation and similar to what she came to the hospital with.  She had some nausea but no current vomiting.   Objective: Vital signs in last 24 hours: Temp:  [98.4 F (36.9 C)-99.7 F (37.6 C)] 99.7 F (37.6 C) (06/20 0455) Pulse Rate:  [70-81] 72 (06/20 0455) Resp:  [16-17] 16 (06/20 0455) BP: (120-163)/(56-71) 135/71 (06/20 0455) SpO2:  [91 %-99 %] 92 % (06/20 0455) Weight:  [72.6 kg (160 lb)-75.8 kg (167 lb 1.7 oz)] 75.8 kg (167 lb 1.7 oz) (06/20 0614) Last BM Date: 11/08/17  Intake/Output from previous day: 06/19 0701 - 06/20 0700 In: 3054 [P.O.:120; I.V.:2384; IV Piggyback:550] Out: -  Intake/Output this shift: No intake/output data recorded.  General appearance: alert and cooperative Head: Normocephalic, without obvious abnormality, atraumatic GI: Mild right upper quadrant tenderness.  No significant rebound or guarding.  Lab Results:  Recent Labs    11/09/17 1101 11/10/17 0629  WBC 9.6 9.3  HGB 13.2 11.2*  HCT 41.5 34.5*  PLT 178 149*   BMET Recent Labs    11/09/17 1101 11/10/17 0629  NA 142 140  K 4.0 3.6  CL 107 111  CO2 26 23  GLUCOSE 177* 118*  BUN 20 16  CREATININE 1.01* 0.92  CALCIUM 9.7 8.6*   PT/INR No results for input(s): LABPROT, INR in the last 72 hours. ABG No results for input(s): PHART, HCO3 in the last 72 hours.  Invalid input(s): PCO2, PO2  Studies/Results: Dg Chest 2 View  Result Date: 11/09/2017 CLINICAL DATA:  Chest pain.  Shortness of breath. EXAM: CHEST - 2 VIEW COMPARISON:  10/08/2017. FINDINGS: Mediastinum hilar structures normal. Low lung volumes with mild bibasilar atelectasis/infiltrates. No pleural effusion or pneumothorax.  Biapical pleural thickening noted consistent scarring. No acute bony abnormality. IMPRESSION: Low lung volumes with mild bibasilar atelectasis/infiltrates. Electronically Signed   By: Marcello Moores  Register   On: 11/09/2017 13:51   US Abdomen Complete  Result Date: 11/09/2017 CLINICAL DATA:  72 year old female with acute abdominal pain. EXAM: ABDOMEN ULTRASOUND COMPLETE COMPARISON:  None. FINDINGS: Gallbladder: Positive for flat oval nonshadowing gallstones in the fundus (image 22) estimated up to 11 millimeters. Gallbladder wall thickness remains normal. Gallbladder size is at the upper limits of normal, but no pericholecystic fluid and no sonographic Murphy sign are noted. Common bile duct: Diameter: 14 millimeters diameter, severely dilated (image 34, normal up to 6 millimeters diameter). Liver: Mildly to moderately increased liver echogenicity (image 41). No intrahepatic biliary ductal dilatation is evident. No discrete liver lesion. Portal vein is patent on color Doppler imaging with normal direction of blood flow towards the liver. IVC: No abnormality visualized. Pancreas: Visualized portion unremarkable. Spleen: Size and appearance within normal limits. Right Kidney: Length: 9.7 centimeters. Echogenicity within normal limits. No mass or hydronephrosis visualized. Left Kidney: Length: 7.1 centimeters, asymmetrically smaller. Echogenicity within normal limits. No mass or hydronephrosis visualized. Abdominal aorta: Incompletely visualized due to overlying bowel gas, visualized portions within normal limits. Other findings: None. IMPRESSION: 1. Severely dilated common bile duct, up to 14 mm. Superimposed cholelithiasis measuring about 11 mm. This constellation is suspicious for Acute Choledocholithiasis. 2. No evidence of acute cholecystitis. 3. Hepatic steatosis. Electronically Signed   By: Genevie Ann M.D.   On: 11/09/2017 15:13  Mr 3d Recon At Scanner  Result Date: 11/09/2017 CLINICAL DATA:  Abnormal liver  functions. Worsening abdominal pain, nausea, and vomiting. Abdominal ultrasound demonstrated choledocholithiasis. EXAM: MRI ABDOMEN WITHOUT AND WITH CONTRAST (INCLUDING MRCP) TECHNIQUE: Multiplanar multisequence MR imaging of the abdomen was performed both before and after the administration of intravenous contrast. Heavily T2-weighted images of the biliary and pancreatic ducts were obtained, and three-dimensional MRCP images were rendered by post processing. CONTRAST:  71mL MULTIHANCE GADOBENATE DIMEGLUMINE 529 MG/ML IV SOLN COMPARISON:  Ultrasound abdomen 11/09/2017 FINDINGS: Motion artifact limits examination. Lower chest: Lung bases are clear. Hepatobiliary: Liver demonstrates a few tiny subcentimeter T2 hyperintense lesions consistent with small cyst. No solid focal lesions identified. No enhancing liver lesions identified. The gallbladder is mildly distended with small stones in the gallbladder neck. There is mild pericholecystic edema. Changes suggest mild acute cholecystitis. There is mild intra and moderate extrahepatic bile duct dilatation with common bile duct measuring up to about 14 mm in diameter. Focal filling defects are demonstrated in the mid and distal common bile duct consistent with common duct stones. Pancreas: No mass, inflammatory changes, or other parenchymal abnormality identified. Spleen:  Within normal limits in size and appearance. Adrenals/Urinary Tract: Left adrenal gland nodule measuring 2.4 cm maximal dimension. There is significant signal loss within the lesion between in and out of phase images consistent with fat content. Appearance is consistent with a benign adenoma. Subcentimeter parenchymal cysts on the left kidney. No hydronephrosis or hydroureter. Stomach/Bowel: Visualized stomach, small bowel, and colon demonstrate no evidence of dilatation, wall thickening, or inflammatory change. Vascular/Lymphatic: Normal caliber abdominal aorta. No aneurysm. Inferior vena cava is  patent. No significant lymphadenopathy. Other: No free air or free fluid demonstrated in the visualized abdomen. Musculoskeletal: No suspicious bone lesions identified. IMPRESSION: 1. Distended gallbladder with cholelithiasis and mild pericholecystic edema suggesting acute cholecystitis. 2. Dilated intra and extrahepatic bile ducts with filling defects in the mid and distal common bile duct consistent with choledocholithiasis. 3. Left adrenal gland nodule has imaging characteristics consistent with benign adenoma. 4. Small subcentimeter cysts in the liver and left kidney. Electronically Signed   By: Lucienne Capers M.D.   On: 11/09/2017 22:00   Mr Abdomen Mrcp Moise Boring Contast  Result Date: 11/09/2017 CLINICAL DATA:  Abnormal liver functions. Worsening abdominal pain, nausea, and vomiting. Abdominal ultrasound demonstrated choledocholithiasis. EXAM: MRI ABDOMEN WITHOUT AND WITH CONTRAST (INCLUDING MRCP) TECHNIQUE: Multiplanar multisequence MR imaging of the abdomen was performed both before and after the administration of intravenous contrast. Heavily T2-weighted images of the biliary and pancreatic ducts were obtained, and three-dimensional MRCP images were rendered by post processing. CONTRAST:  24mL MULTIHANCE GADOBENATE DIMEGLUMINE 529 MG/ML IV SOLN COMPARISON:  Ultrasound abdomen 11/09/2017 FINDINGS: Motion artifact limits examination. Lower chest: Lung bases are clear. Hepatobiliary: Liver demonstrates a few tiny subcentimeter T2 hyperintense lesions consistent with small cyst. No solid focal lesions identified. No enhancing liver lesions identified. The gallbladder is mildly distended with small stones in the gallbladder neck. There is mild pericholecystic edema. Changes suggest mild acute cholecystitis. There is mild intra and moderate extrahepatic bile duct dilatation with common bile duct measuring up to about 14 mm in diameter. Focal filling defects are demonstrated in the mid and distal common bile duct  consistent with common duct stones. Pancreas: No mass, inflammatory changes, or other parenchymal abnormality identified. Spleen:  Within normal limits in size and appearance. Adrenals/Urinary Tract: Left adrenal gland nodule measuring 2.4 cm maximal dimension. There is significant signal loss within the  lesion between in and out of phase images consistent with fat content. Appearance is consistent with a benign adenoma. Subcentimeter parenchymal cysts on the left kidney. No hydronephrosis or hydroureter. Stomach/Bowel: Visualized stomach, small bowel, and colon demonstrate no evidence of dilatation, wall thickening, or inflammatory change. Vascular/Lymphatic: Normal caliber abdominal aorta. No aneurysm. Inferior vena cava is patent. No significant lymphadenopathy. Other: No free air or free fluid demonstrated in the visualized abdomen. Musculoskeletal: No suspicious bone lesions identified. IMPRESSION: 1. Distended gallbladder with cholelithiasis and mild pericholecystic edema suggesting acute cholecystitis. 2. Dilated intra and extrahepatic bile ducts with filling defects in the mid and distal common bile duct consistent with choledocholithiasis. 3. Left adrenal gland nodule has imaging characteristics consistent with benign adenoma. 4. Small subcentimeter cysts in the liver and left kidney. Electronically Signed   By: Lucienne Capers M.D.   On: 11/09/2017 22:00    Anti-infectives: Anti-infectives (From admission, onward)   Start     Dose/Rate Route Frequency Ordered Stop   11/10/17 0730  cefTRIAXone (ROCEPHIN) 2 g in sodium chloride 0.9 % 100 mL IVPB     2 g 200 mL/hr over 30 Minutes Intravenous Every 24 hours 11/10/17 0727     11/09/17 1800  metroNIDAZOLE (FLAGYL) IVPB 500 mg     500 mg 100 mL/hr over 60 Minutes Intravenous Every 8 hours 11/09/17 1721     11/09/17 1745  piperacillin-tazobactam (ZOSYN) IVPB 3.375 g     3.375 g 100 mL/hr over 30 Minutes Intravenous  Once 11/09/17 1740 11/09/17 1856       Assessment/Plan: Choledocholithiasis-for ERCP today  Probable chronic cholecystitis-for laparoscopic cholecystectomy tomorrow.  May have clear liquids after ERCP today.  N.p.o. after midnight   LOS: 2 days    Joyice Faster Sherece Gambrill 11/11/2017

## 2017-11-11 NOTE — Interval H&P Note (Signed)
History and Physical Interval Note:  11/11/2017 11:34 AM  Sandra Schwartz  has presented today for surgery, with the diagnosis of choledocholithiasis  The various methods of treatment have been discussed with the patient and family. After consideration of risks, benefits and other options for treatment, the patient has consented to  Procedure(s): ENDOSCOPIC RETROGRADE CHOLANGIOPANCREATOGRAPHY (ERCP) WITH PROPOFOL (N/A) as a surgical intervention .  The patient's history has been reviewed, patient examined, no change in status, stable for surgery.  I have reviewed the patient's chart and labs.  Questions were answered to the patient's satisfaction.     Pricilla Riffle. Fuller Plan

## 2017-11-11 NOTE — Transfer of Care (Signed)
Immediate Anesthesia Transfer of Care Note  Patient: Sandra Schwartz  Procedure(s) Performed: ENDOSCOPIC RETROGRADE CHOLANGIOPANCREATOGRAPHY (ERCP) WITH PROPOFOL (N/A ) SPHINCTEROTOMY REMOVAL OF STONES  Patient Location: Endoscopy Unit  Anesthesia Type:General  Level of Consciousness: awake, alert  and oriented  Airway & Oxygen Therapy: Patient Spontanous Breathing and Patient connected to nasal cannula oxygen  Post-op Assessment: Report given to RN and Post -op Vital signs reviewed and stable  Post vital signs: Reviewed and stable  Last Vitals:  Vitals Value Taken Time  BP    Temp    Pulse 66 11/11/2017 12:42 PM  Resp 28 11/11/2017 12:42 PM  SpO2 96 % 11/11/2017 12:42 PM  Vitals shown include unvalidated device data.  Last Pain:  Vitals:   11/11/17 1113  TempSrc: Oral  PainSc: 0-No pain         Complications: No apparent anesthesia complications

## 2017-11-12 ENCOUNTER — Encounter (HOSPITAL_COMMUNITY)
Admission: EM | Disposition: A | Discharge status: Home / Self Care | Payer: Self-pay | Source: Home / Self Care | Attending: Family Medicine

## 2017-11-12 ENCOUNTER — Inpatient Hospital Stay (HOSPITAL_COMMUNITY): Payer: Medicare Other | Admitting: Certified Registered Nurse Anesthetist

## 2017-11-12 ENCOUNTER — Encounter (HOSPITAL_COMMUNITY): Payer: Self-pay | Admitting: Certified Registered Nurse Anesthetist

## 2017-11-12 HISTORY — PX: CHOLECYSTECTOMY: SHX55

## 2017-11-12 LAB — BASIC METABOLIC PANEL
Anion gap: 6 (ref 5–15)
BUN: 8 mg/dL (ref 6–20)
CHLORIDE: 111 mmol/L (ref 101–111)
CO2: 24 mmol/L (ref 22–32)
Calcium: 8.2 mg/dL — ABNORMAL LOW (ref 8.9–10.3)
Creatinine, Ser: 0.76 mg/dL (ref 0.44–1.00)
Glucose, Bld: 128 mg/dL — ABNORMAL HIGH (ref 65–99)
Potassium: 3.3 mmol/L — ABNORMAL LOW (ref 3.5–5.1)
SODIUM: 141 mmol/L (ref 135–145)

## 2017-11-12 LAB — CBC
HCT: 31.8 % — ABNORMAL LOW (ref 36.0–46.0)
HEMOGLOBIN: 10.3 g/dL — AB (ref 12.0–15.0)
MCH: 29.9 pg (ref 26.0–34.0)
MCHC: 32.4 g/dL (ref 30.0–36.0)
MCV: 92.2 fL (ref 78.0–100.0)
Platelets: 135 10*3/uL — ABNORMAL LOW (ref 150–400)
RBC: 3.45 MIL/uL — ABNORMAL LOW (ref 3.87–5.11)
RDW: 14.9 % (ref 11.5–15.5)
WBC: 4 10*3/uL (ref 4.0–10.5)

## 2017-11-12 LAB — HEPATIC FUNCTION PANEL
ALBUMIN: 2.7 g/dL — AB (ref 3.5–5.0)
ALK PHOS: 94 U/L (ref 38–126)
ALT: 113 U/L — AB (ref 14–54)
AST: 97 U/L — AB (ref 15–41)
Bilirubin, Direct: 1.6 mg/dL — ABNORMAL HIGH (ref 0.1–0.5)
Indirect Bilirubin: 1.2 mg/dL — ABNORMAL HIGH (ref 0.3–0.9)
TOTAL PROTEIN: 5.5 g/dL — AB (ref 6.5–8.1)
Total Bilirubin: 2.8 mg/dL — ABNORMAL HIGH (ref 0.3–1.2)

## 2017-11-12 LAB — MAGNESIUM: Magnesium: 2 mg/dL (ref 1.7–2.4)

## 2017-11-12 SURGERY — LAPAROSCOPIC CHOLECYSTECTOMY
Anesthesia: General | Site: Abdomen

## 2017-11-12 MED ORDER — BUPIVACAINE-EPINEPHRINE 0.25% -1:200000 IJ SOLN
INTRAMUSCULAR | Status: DC | PRN
  Administered 2017-11-12: 9 mL

## 2017-11-12 MED ORDER — PROPOFOL 10 MG/ML IV BOLUS
INTRAVENOUS | Status: DC | PRN
  Administered 2017-11-12: 40 mg via INTRAVENOUS
  Administered 2017-11-12: 100 mg via INTRAVENOUS

## 2017-11-12 MED ORDER — MIDAZOLAM HCL 2 MG/2ML IJ SOLN
INTRAMUSCULAR | Status: AC
  Filled 2017-11-12: qty 2

## 2017-11-12 MED ORDER — HYDROMORPHONE HCL 1 MG/ML IJ SOLN: 0.2500 mg | INTRAMUSCULAR | Status: DC | PRN

## 2017-11-12 MED ORDER — 0.9 % SODIUM CHLORIDE (POUR BTL) OPTIME
TOPICAL | Status: DC | PRN
  Administered 2017-11-12: 1000 mL

## 2017-11-12 MED ORDER — FENTANYL CITRATE (PF) 100 MCG/2ML IJ SOLN
INTRAMUSCULAR | Status: DC | PRN
  Administered 2017-11-12 (×5): 50 ug via INTRAVENOUS

## 2017-11-12 MED ORDER — PROMETHAZINE HCL 25 MG/ML IJ SOLN: 6.2500 mg | INTRAMUSCULAR | Status: DC | PRN

## 2017-11-12 MED ORDER — BUPIVACAINE-EPINEPHRINE (PF) 0.25% -1:200000 IJ SOLN
INTRAMUSCULAR | Status: AC
  Filled 2017-11-12: qty 30

## 2017-11-12 MED ORDER — SIMETHICONE 80 MG PO CHEW
80.0000 mg | CHEWABLE_TABLET | Freq: Four times a day (QID) | ORAL | Status: DC | PRN
  Administered 2017-11-12 (×2): 80 mg via ORAL
  Filled 2017-11-12 (×2): qty 1

## 2017-11-12 MED ORDER — DEXAMETHASONE SODIUM PHOSPHATE 10 MG/ML IJ SOLN
INTRAMUSCULAR | Status: DC | PRN
  Administered 2017-11-12: 5 mg via INTRAVENOUS

## 2017-11-12 MED ORDER — LACTATED RINGERS IV SOLN
INTRAVENOUS | Status: DC
  Administered 2017-11-12: 08:00:00 via INTRAVENOUS

## 2017-11-12 MED ORDER — VECURONIUM BROMIDE 10 MG IV SOLR
INTRAVENOUS | Status: DC | PRN
  Administered 2017-11-12: 2 mg via INTRAVENOUS

## 2017-11-12 MED ORDER — LIDOCAINE 2% (20 MG/ML) 5 ML SYRINGE
INTRAMUSCULAR | Status: AC
  Filled 2017-11-12: qty 5

## 2017-11-12 MED ORDER — SUGAMMADEX SODIUM 200 MG/2ML IV SOLN
INTRAVENOUS | Status: AC
  Filled 2017-11-12: qty 2

## 2017-11-12 MED ORDER — EPHEDRINE SULFATE 50 MG/ML IJ SOLN
INTRAMUSCULAR | Status: DC | PRN
  Administered 2017-11-12: 10 mg via INTRAVENOUS

## 2017-11-12 MED ORDER — SODIUM CHLORIDE 0.9 % IJ SOLN
INTRAMUSCULAR | Status: AC
  Filled 2017-11-12: qty 10

## 2017-11-12 MED ORDER — VECURONIUM BROMIDE 10 MG IV SOLR
INTRAVENOUS | Status: AC
  Filled 2017-11-12: qty 10

## 2017-11-12 MED ORDER — ONDANSETRON HCL 4 MG/2ML IJ SOLN
INTRAMUSCULAR | Status: DC | PRN
  Administered 2017-11-12: 4 mg via INTRAVENOUS

## 2017-11-12 MED ORDER — POTASSIUM CHLORIDE CRYS ER 20 MEQ PO TBCR
40.0000 meq | EXTENDED_RELEASE_TABLET | Freq: Once | ORAL | Status: AC
  Administered 2017-11-12: 40 meq via ORAL
  Filled 2017-11-12: qty 2

## 2017-11-12 MED ORDER — SODIUM CHLORIDE 0.9 % IR SOLN
Status: DC | PRN
  Administered 2017-11-12: 1000 mL

## 2017-11-12 MED ORDER — SUGAMMADEX SODIUM 200 MG/2ML IV SOLN
INTRAVENOUS | Status: DC | PRN
  Administered 2017-11-12: 200 mg via INTRAVENOUS

## 2017-11-12 MED ORDER — EPHEDRINE SULFATE 50 MG/ML IJ SOLN
INTRAMUSCULAR | Status: AC
  Filled 2017-11-12: qty 1

## 2017-11-12 MED ORDER — LABETALOL HCL 5 MG/ML IV SOLN
INTRAVENOUS | Status: DC | PRN
  Administered 2017-11-12: 5 mg via INTRAVENOUS

## 2017-11-12 MED ORDER — FENTANYL CITRATE (PF) 250 MCG/5ML IJ SOLN
INTRAMUSCULAR | Status: AC
  Filled 2017-11-12: qty 5

## 2017-11-12 MED ORDER — ROCURONIUM BROMIDE 10 MG/ML (PF) SYRINGE
PREFILLED_SYRINGE | INTRAVENOUS | Status: DC | PRN
  Administered 2017-11-12: 50 mg via INTRAVENOUS

## 2017-11-12 MED ORDER — HEMOSTATIC AGENTS (NO CHARGE) OPTIME
TOPICAL | Status: DC | PRN
  Administered 2017-11-12: 1 via TOPICAL

## 2017-11-12 SURGICAL SUPPLY — 43 items
ADH SKN CLS APL DERMABOND .7 (GAUZE/BANDAGES/DRESSINGS) ×1
APPLIER CLIP ROT 10 11.4 M/L (STAPLE) ×3
APR CLP MED LRG 11.4X10 (STAPLE) ×1
BAG SPEC RTRVL 10 TROC 200 (ENDOMECHANICALS) ×1
BLADE CLIPPER SURG (BLADE) IMPLANT
CANISTER SUCT 3000ML PPV (MISCELLANEOUS) ×3 IMPLANT
CHLORAPREP W/TINT 26ML (MISCELLANEOUS) ×3 IMPLANT
CLIP APPLIE ROT 10 11.4 M/L (STAPLE) ×1 IMPLANT
COVER SURGICAL LIGHT HANDLE (MISCELLANEOUS) ×3 IMPLANT
DERMABOND ADVANCED (GAUZE/BANDAGES/DRESSINGS) ×2
DERMABOND ADVANCED .7 DNX12 (GAUZE/BANDAGES/DRESSINGS) ×1 IMPLANT
DRAPE WARM FLUID 44X44 (DRAPE) ×3 IMPLANT
ELECT REM PT RETURN 9FT ADLT (ELECTROSURGICAL) ×3
ELECTRODE REM PT RTRN 9FT ADLT (ELECTROSURGICAL) ×1 IMPLANT
FILTER SMOKE EVAC LAPAROSHD (FILTER) ×2 IMPLANT
GLOVE BIO SURGEON STRL SZ8 (GLOVE) ×3 IMPLANT
GLOVE BIOGEL PI IND STRL 8 (GLOVE) ×1 IMPLANT
GLOVE BIOGEL PI INDICATOR 8 (GLOVE) ×2
GOWN STRL REUS W/ TWL LRG LVL3 (GOWN DISPOSABLE) ×2 IMPLANT
GOWN STRL REUS W/ TWL XL LVL3 (GOWN DISPOSABLE) ×1 IMPLANT
GOWN STRL REUS W/TWL LRG LVL3 (GOWN DISPOSABLE) ×6
GOWN STRL REUS W/TWL XL LVL3 (GOWN DISPOSABLE) ×3
HEMOSTAT SNOW SURGICEL 2X4 (HEMOSTASIS) ×2 IMPLANT
KIT BASIN OR (CUSTOM PROCEDURE TRAY) ×3 IMPLANT
KIT TURNOVER KIT B (KITS) ×3 IMPLANT
NS IRRIG 1000ML POUR BTL (IV SOLUTION) ×3 IMPLANT
PAD ARMBOARD 7.5X6 YLW CONV (MISCELLANEOUS) ×7 IMPLANT
POUCH RETRIEVAL ECOSAC 10 (ENDOMECHANICALS) ×1 IMPLANT
POUCH RETRIEVAL ECOSAC 10MM (ENDOMECHANICALS) ×2
SCISSORS LAP 5X35 DISP (ENDOMECHANICALS) ×3 IMPLANT
SET IRRIG TUBING LAPAROSCOPIC (IRRIGATION / IRRIGATOR) ×3 IMPLANT
SLEEVE ENDOPATH XCEL 5M (ENDOMECHANICALS) ×3 IMPLANT
SPECIMEN JAR SMALL (MISCELLANEOUS) ×3 IMPLANT
SUT MNCRL AB 4-0 PS2 18 (SUTURE) ×3 IMPLANT
TOWEL OR 17X24 6PK STRL BLUE (TOWEL DISPOSABLE) ×3 IMPLANT
TOWEL OR 17X26 10 PK STRL BLUE (TOWEL DISPOSABLE) ×3 IMPLANT
TRAY LAPAROSCOPIC MC (CUSTOM PROCEDURE TRAY) ×3 IMPLANT
TROCAR BLADELESS 12MM (ENDOMECHANICALS) ×2 IMPLANT
TROCAR XCEL BLUNT TIP 100MML (ENDOMECHANICALS) ×3 IMPLANT
TROCAR XCEL NON-BLD 11X100MML (ENDOMECHANICALS) ×3 IMPLANT
TROCAR XCEL NON-BLD 5MMX100MML (ENDOMECHANICALS) ×3 IMPLANT
TUBING INSUFFLATION (TUBING) ×3 IMPLANT
WATER STERILE IRR 1000ML POUR (IV SOLUTION) ×3 IMPLANT

## 2017-11-12 NOTE — Anesthesia Procedure Notes (Signed)
Procedure Name: Intubation Date/Time: 11/12/2017 8:35 AM Performed by: Colin Benton, CRNA Pre-anesthesia Checklist: Emergency Drugs available, Suction available, Patient identified and Patient being monitored Patient Re-evaluated:Patient Re-evaluated prior to induction Oxygen Delivery Method: Circle system utilized Preoxygenation: Pre-oxygenation with 100% oxygen Induction Type: IV induction Ventilation: Mask ventilation without difficulty Laryngoscope Size: Miller and 2 Grade View: Grade I Tube type: Oral Tube size: 7.0 mm Number of attempts: 1 Airway Equipment and Method: Stylet Placement Confirmation: ETT inserted through vocal cords under direct vision,  positive ETCO2 and breath sounds checked- equal and bilateral Secured at: 21 cm Tube secured with: Tape Dental Injury: Teeth and Oropharynx as per pre-operative assessment

## 2017-11-12 NOTE — Anesthesia Preprocedure Evaluation (Addendum)
Anesthesia Evaluation  Patient identified by MRN, date of birth, ID band Patient awake    Reviewed: Allergy & Precautions, NPO status , Patient's Chart, lab work & pertinent test results  Airway Mallampati: II  TM Distance: >3 FB Neck ROM: Full    Dental no notable dental hx.    Pulmonary asthma , COPD, former smoker,  96 pack yr hx   breath sounds clear to auscultation + decreased breath sounds      Cardiovascular hypertension, Normal cardiovascular exam Rhythm:Regular Rate:Normal     Neuro/Psych negative neurological ROS  negative psych ROS   GI/Hepatic negative GI ROS, Neg liver ROS,   Endo/Other  negative endocrine ROS  Renal/GU negative Renal ROS  negative genitourinary   Musculoskeletal  (+) Arthritis , Rheumatoid disorders,    Abdominal   Peds negative pediatric ROS (+)  Hematology negative hematology ROS (+)   Anesthesia Other Findings   Reproductive/Obstetrics negative OB ROS                            Anesthesia Physical Anesthesia Plan  ASA: IV  Anesthesia Plan: General   Post-op Pain Management:    Induction: Intravenous  PONV Risk Score and Plan: 2 and Ondansetron, Dexamethasone and Treatment may vary due to age or medical condition  Airway Management Planned: Oral ETT  Additional Equipment:   Intra-op Plan:   Post-operative Plan: Possible Post-op intubation/ventilation  Informed Consent: I have reviewed the patients History and Physical, chart, labs and discussed the procedure including the risks, benefits and alternatives for the proposed anesthesia with the patient or authorized representative who has indicated his/her understanding and acceptance.   Dental advisory given  Plan Discussed with: CRNA and Surgeon  Anesthesia Plan Comments:         Anesthesia Quick Evaluation

## 2017-11-12 NOTE — Anesthesia Postprocedure Evaluation (Signed)
Anesthesia Post Note  Patient: Sandra Schwartz  Procedure(s) Performed: LAPAROSCOPIC CHOLECYSTECTOMY (N/A Abdomen)     Patient location during evaluation: PACU Anesthesia Type: General Level of consciousness: awake and alert Pain management: pain level controlled Vital Signs Assessment: post-procedure vital signs reviewed and stable Respiratory status: spontaneous breathing, nonlabored ventilation, respiratory function stable and patient connected to nasal cannula oxygen Cardiovascular status: blood pressure returned to baseline and stable Postop Assessment: no apparent nausea or vomiting Anesthetic complications: no    Last Vitals:  Vitals:   11/12/17 1025 11/12/17 1035  BP: (!) 142/68 (!) 146/77  Pulse: 60 62  Resp: 18 19  Temp: (!) 36.4 C   SpO2: 92% 92%    Last Pain:  Vitals:   11/12/17 1025  TempSrc:   PainSc: 0-No pain                 Myranda Pavone S

## 2017-11-12 NOTE — Plan of Care (Signed)

## 2017-11-12 NOTE — Interval H&P Note (Signed)
History and Physical Interval Note:  11/12/2017 8:05 AM  [name]  has presented today for surgery, with the diagnosis of CHOLEDOCHOLITHIASIS  The various methods of treatment have been discussed with the patient and family. After consideration of risks, benefits and other options for treatment, the patient has consented to  Procedure(s): LAPAROSCOPIC CHOLECYSTECTOMY (N/A) as a surgical intervention .  The patient's history has been reviewed, patient examined, no change in status, stable for surgery.  I have reviewed the patient's chart and labs.  Questions were answered to the patient's satisfaction.  The procedure has been discussed with the patient. Operative and non operative treatments have been discussed. Risks of surgery include bleeding, infection,  Common bile duct injury,  Injury to the stomach,liver, colon,small intestine, abdominal wall,  Diaphragm,  Major blood vessels,  And the need for an open procedure.  Other risks include worsening of medical problems, death,  DVT and pulmonary embolism, and cardiovascular events.   Medical options have also been discussed. The patient has been informed of long term expectations of surgery and non surgical options,  The patient agrees to proceed.     Sandra Schwartz

## 2017-11-12 NOTE — Progress Notes (Signed)
Day of Surgery   Subjective/Chief Complaint: none  Pt without complaint   Objective: Vital signs in last 24 hours: Temp:  [97.5 F (36.4 C)-99.2 F (37.3 C)] 97.5 F (36.4 C) (06/21 0446) Pulse Rate:  [59-77] 60 (06/21 0446) Resp:  [16-25] 16 (06/21 0446) BP: (95-187)/(62-96) 139/65 (06/21 0446) SpO2:  [94 %-99 %] 96 % (06/21 0446) Last BM Date: 11/12/17  Intake/Output from previous day: 06/20 0701 - 06/21 0700 In: 1517.2 [P.O.:360; I.V.:1064.4; IV Piggyback:92.8] Out: -  Intake/Output this shift: No intake/output data recorded.  GI: soft, non-tender; bowel sounds normal; no masses,  no organomegaly small scar midline   Lab Results:  Recent Labs    11/11/17 0659 11/12/17 0750  WBC 5.5 4.0  HGB 10.3* 10.3*  HCT 32.9* 31.8*  PLT 133* 135*   BMET Recent Labs    11/10/17 0629 11/11/17 0659  NA 140 140  K 3.6 3.9  CL 111 110  CO2 23 24  GLUCOSE 118* 108*  BUN 16 10  CREATININE 0.92 0.83  CALCIUM 8.6* 8.3*   PT/INR No results for input(s): LABPROT, INR in the last 72 hours. ABG No results for input(s): PHART, HCO3 in the last 72 hours.  Invalid input(s): PCO2, PO2  Studies/Results: Dg Ercp Biliary & Pancreatic Ducts  Result Date: 11/11/2017 CLINICAL DATA:  ERCP EXAM: ERCP TECHNIQUE: Multiple spot images obtained with the fluoroscopic device and submitted for interpretation post-procedure. FLUOROSCOPY TIME:  Fluoroscopy Time:  1 minutes and 51 seconds Radiation Exposure Index (if provided by the fluoroscopic device): Number of Acquired Spot Images: 0 COMPARISON:  None. FINDINGS: Cannulation of the common bile duct is achieved. Contrast fills the biliary tree. A filling defect in the upper common bile duct is noted. Balloon stone retrieval is documented. Final imaging demonstrates resolution of the duct stone. IMPRESSION: Successful balloon retrieval of a common bile duct stone. These images were submitted for radiologic interpretation only. Please see the  procedural report for the amount of contrast and the fluoroscopy time utilized. Electronically Signed   By: Marybelle Killings M.D.   On: 11/11/2017 13:59    Anti-infectives: Anti-infectives (From admission, onward)   Start     Dose/Rate Route Frequency Ordered Stop   11/10/17 0730  [MAR Hold]  cefTRIAXone (ROCEPHIN) 2 g in sodium chloride 0.9 % 100 mL IVPB     (MAR Hold since Fri 11/12/2017 at 0801. Reason: Transfer to a Procedural area.)   2 g 200 mL/hr over 30 Minutes Intravenous Every 24 hours 11/10/17 0727     11/09/17 1800  [MAR Hold]  metroNIDAZOLE (FLAGYL) IVPB 500 mg     (MAR Hold since Fri 11/12/2017 at [facility]. Reason: Transfer to a Procedural area.)   500 mg 100 mL/hr over 60 Minutes Intravenous Every 8 hours 11/09/17 1721     11/09/17 1745  piperacillin-tazobactam (ZOSYN) IVPB 3.375 g     3.375 g 100 mL/hr over 30 Minutes Intravenous  Once 11/09/17 1740 11/09/17 1856      Assessment/Plan: choledocolithiasis s/p ERCP  LAP CHOLE TODAY   The procedure has been discussed with the patient. Operative and non operative treatments have been discussed. Risks of surgery include bleeding, infection,  Common bile duct injury,  Injury to the stomach,liver, colon,small intestine, abdominal wall,  Diaphragm,  Major blood vessels,  And the need for an open procedure.  Other risks include worsening of medical problems, death,  DVT and pulmonary embolism, and cardiovascular events.   Medical options have also been discussed. The  patient has been informed of long term expectations of surgery and non surgical options,  The patient agrees to proceed.      LOS: 3 days    Sandra Schwartz 11/12/2017

## 2017-11-12 NOTE — Progress Notes (Signed)
RN called to room by NT. Pt found to be in acute respiratory distress sitting EOB on RA. PRN orders for Albuterol administered with O2. SpO2 was 99%. When pt caught her breath she was able to articulate that SOB was d/t gas pain. MD Florene Glen paged and Simethicone PRN ordered. Pt resting comfortably now.

## 2017-11-12 NOTE — Op Note (Signed)
Laparoscopic Cholecystectomy  Procedure Note  Indications: This patient presents with symptomatic gallbladder disease and CBD stone s/p ERCP and will undergo laparoscopic cholecystectomy.The procedure has been discussed with the patient. Operative and non operative treatments have been discussed. Risks of surgery include bleeding, infection,  Common bile duct injury,  Injury to the stomach,liver, colon,small intestine, abdominal wall,  Diaphragm,  Major blood vessels,  And the need for an open procedure.  Other risks include worsening of medical problems, death,  DVT and pulmonary embolism, and cardiovascular events.   Medical options have also been discussed. The patient has been informed of long term expectations of surgery and non surgical options,  The patient agrees to proceed.    Pre-operative Diagnosis: Calculus of the bile duct  with acute cholecystitis, without mention of obstruction  Post-operative Diagnosis: Calculus of bile duct with acute cholecystitis without mention of obstruction  Surgeon: Joyice Faster Jemari Hallum   Assistants: OR staff  Anesthesia: General endotracheal anesthesia and Local anesthesia 0.25.% bupivacaine, with epinephrine  ASA Class: 3  Procedure Details  The patient was seen again in the Holding Room. The risks, benefits, complications, treatment options, and expected outcomes were discussed with the patient. The possibilities of reaction to medication, pulmonary aspiration, perforation of viscus, bleeding, recurrent infection, finding a normal gallbladder, the need for additional procedures, failure to diagnose a condition, the possible need to convert to an open procedure, and creating a complication requiring transfusion or operation were discussed with the patient. The patient and/or family concurred with the proposed plan, giving informed consent. The site of surgery properly noted/marked. The patient was taken to Operating Room, identified as Sandra Schwartz and the  procedure verified as Laparoscopic Cholecystectomy with Intraoperative Cholangiograms. A Time Out was held and the above information confirmed.  Prior to the induction of general anesthesia, antibiotic prophylaxis was administered. General endotracheal anesthesia was then administered and tolerated well. After the induction, the abdomen was prepped in the usual sterile fashion. The patient was positioned in the supine position with the left arm comfortably tucked, along with some reverse Trendelenburg.  Local anesthetic agent was injected into the skin near the umbilicus and an incision made. The midline fascia was incised and the Hasson technique was used to introduce a 12 mm port under direct vision. It was secured with a figure of eight Vicryl suture placed in the usual fashion. Pneumoperitoneum was then created with CO2 and tolerated well without any adverse changes in the patient's vital signs. Additional trocars were introduced under direct vision with an 11 mm trocar in the epigastrium and two  5 mm trocars in the right upper quadrant. All skin incisions were infiltrated with a local anesthetic agent before making the incision and placing the trocars.   The gallbladder was identified, the fundus grasped and retracted cephalad. Adhesions were lysed bluntly and with the electrocautery where indicated, taking care not to injure any adjacent organs or viscus. The infundibulum was grasped and retracted laterally, exposing the peritoneum overlying the triangle of Calot. This was then divided and exposed in a blunt fashion. The cystic duct was clearly identified and bluntly dissected circumferentially. The junctions of the gallbladder, cystic duct and common bile duct were clearly identified prior to the division of any linear structure.   Critical view obtained.  Her tissues were extremely fragile and viable.  Concern for puncture of the cystic / common bile duct was high therefore IOC was not done.  The  cystic duct was then  ligated with surgical  clips  on the patient side and  clipped on the gallbladder side and divided. The cystic artery was identified, dissected free, ligated with clips and divided as well. Posterior cystic artery clipped and divided.  The gallbladder was dissected from the liver bed in retrograde fashion with the electrocautery. The gallbladder was removed. The liver bed was irrigated and inspected. Hemostasis was achieved with the electrocautery. Copious irrigation was utilized and was repeatedly aspirated until clear all particulate matter. Hemostasis was achieved with no signs  of bleeding or bile leakage.  Pneumoperitoneum was completely reduced after viewing removal of the trocars under direct vision. The wound was thoroughly irrigated and the fascia was then closed with a figure of eight suture; the skin was then closed with 4 O monocryl  and a sterile dressing was applied.  Instrument, sponge, and needle counts were correct at closure and at the conclusion of the case.   Findings: Cholecystitis with Cholelithiasis  Estimated Blood Loss: less than 50 mL         Drains: none          Total IV Fluids: per anesthesia record          Specimens: Gallbladder           Complications: None; patient tolerated the procedure well.         Disposition: PACU - hemodynamically stable.         Condition: stable

## 2017-11-12 NOTE — Transfer of Care (Signed)
Immediate Anesthesia Transfer of Care Note  Patient: Sandra Schwartz  Procedure(s) Performed: LAPAROSCOPIC CHOLECYSTECTOMY (N/A Abdomen)  Patient Location: PACU  Anesthesia Type:General  Level of Consciousness: drowsy and patient cooperative  Airway & Oxygen Therapy: Patient Spontanous Breathing and Patient connected to face mask oxygen  Post-op Assessment: Report given to RN and Post -op Vital signs reviewed and stable  Post vital signs: Reviewed and stable  Last Vitals:  Vitals Value Taken Time  BP 167/73 11/12/2017  9:58 AM  Temp 36.3 C 11/12/2017  9:58 AM  Pulse 67 11/12/2017  9:59 AM  Resp 17 11/12/2017  9:59 AM  SpO2 100 % 11/12/2017  9:59 AM  Vitals shown include unvalidated device data.  Last Pain:  Vitals:   11/12/17 0446  TempSrc: Oral  PainSc:       Patients Stated Pain Goal: 2 (87/21/58 7276)  Complications: No apparent anesthesia complications

## 2017-11-12 NOTE — Progress Notes (Signed)
BP 161/71 Text paged DR on call

## 2017-11-12 NOTE — Progress Notes (Addendum)
PROGRESS NOTE    Sandra Schwartz  HER:[id number] DOB: [birth year] DOA: 11/09/2017 PCP: Hoyt Koch, MD   Brief Narrative:  Sandra Schwartz is a very pleasant 72 y.o. female with medical history significant for hypertension, GERD, rheumatoid arthritis, who presented to ED with nausea, vomiting, abdominal pain and was found to have cholecystitis and choledocholithiasis.    GI and surgery following.   Assessment & Plan:   Principal Problem:   Choledocholithiasis Active Problems:   COPD (chronic obstructive pulmonary disease) (HCC)   Hyperlipidemia   Rheumatoid arthritis ([place])   Essential hypertension   Elevated transaminase level   Abnormal liver function   #1. Choledocholithiasis  Cholecystitis:  [place] with dilated CBD, up to 14 mm with cholelithiasis as well.  MRCP with distended gallbladder with cholelithiasis and mild pericholecystic edema suggesting acute cholecystitis.  MRCP also showed findings c/w choledocholithiasis.  AST/ALT improved today, tbili improving. S/p ERCP on 6/20 with stones/sludge in CBD and L and R main hepatic ducts.  S/p biliary sphincterotomy and balloon extraction.  Avoid ASA and NSAIDs x 1 week. - plan for OR today for cholecystectomy  - IVF - ceftriaxone/flagyl - NPO - IV analgesia and antiemetics  #2. Elevated Liver Enzymes: as noted above, AST/ALT improving, as well as bilirubin.  2/2 above, continue to monitor.  Labs pending collection today.  # 3. Hypertension. Not on any home meds.   # 4 Rheumatoid arthritis. Appears stable and at baseline.  She tells me she's no longer taking prednisone.  Only methotrexate, holding this for now.    #5. COPD. Stable at baseline. 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 - The common bile duct was dilated, with stones, sludge causing an obstruction. - The left main hepatic duct and right main hepatic duct were dilated, with stones, sludge causing an obstruction. - Choledocholithiasis was found. Complete removal was accomplished by biliary sphincterotomy and balloon extraction. - A biliary sphincterotomy was performed. - The biliary tree was swept. - Periampullary diverticulum. - Avoid aspirin and nonsteroidal anti-inflammatory medicines for 1 week. - Return patient to hospital ward for ongoing care. - Trend LFTs. Recommendation: - Avoid aspirin and nonsteroidal anti-inflammatory medicines for 1 week. - Return patient to hospital ward for ongoing care. - Trend LFTs.  Antimicrobials:  Anti-infectives (From admission, onward)   Start     Dose/Rate Route Frequency Ordered Stop   11/10/17 0730  cefTRIAXone (ROCEPHIN) 2 g in sodium chloride 0.9 % 100 mL IVPB     2 g 200 mL/hr over 30 Minutes Intravenous Every 24 hours 11/10/17 0727     11/09/17 1800  metroNIDAZOLE (FLAGYL) IVPB 500 mg     500 mg 100 mL/hr over 60 Minutes Intravenous Every 8 hours 11/09/17 1721     11/09/17 1745  piperacillin-tazobactam (ZOSYN) IVPB 3.375 g     3.375 g 100 mL/hr over 30 Minutes Intravenous  Once 11/09/17 1740 11/09/17 1856      Subjective: Feeling better, looking forward to getting surgery over with. Still some mild ttp.  Objective: Vitals:   11/11/17 1429 11/11/17 2141 11/11/17 2146  11/12/17 0446  BP: 95/80 (!) 149/69 (!) 149/96 139/65  Pulse: 72 66 77 60  Resp:  16 18 16   Temp: 98.4 F (36.9 C) 98.4 F (36.9 C) 99.2 F (37.3 C) (!) 97.5 F (36.4 C)  TempSrc: Oral Oral Oral Oral  SpO2: 96% 97% 96% 96%  Weight:      Height:         Intake/Output Summary (Last 24 hours) at 11/12/2017 0731 Last data filed at 11/11/2017 2209 Gross per 24 hour  Intake 1517.23 ml  Output -  Net 1517.23 ml   Filed Weights   11/10/17 1949 11/11/17 0614  Weight: 72.6 kg (160 lb) 75.8 kg (167 lb 1.7 oz)    Examination:  General: No acute distress. Cardiovascular: Heart sounds show a regular rate, and rhythm. No gallops or rubs. No murmurs. No JVD. Lungs: Clear to auscultation bilaterally with good air movement. No rales, rhonchi or wheezes. Abdomen: Soft, mild epigastric and RUQ ttp, nondistended  Neurological: Alert and oriented 3. Moves all extremities 4. Cranial nerves II through XII grossly intact. Skin: Warm and dry. No rashes or lesions. Extremities: No clubbing or cyanosis. No edema.  Psychiatric: Mood and affect are normal. Insight and judgment are appropirate.   Data Reviewed: I have personally reviewed following labs and imaging studies  CBC: Recent Labs  Lab 11/09/17 1101 11/10/17 0629 11/11/17 0659  WBC 9.6 9.3 5.5  HGB 13.2 11.2* 10.3*  HCT 41.5 34.5* 32.9*  MCV 93.7 93.2 93.5  PLT 178 149* 740*   Basic Metabolic Panel: Recent Labs  Lab 11/08/17 1632 11/09/17 1101 11/10/17 0629 11/11/17 0659  NA 138 142 140 140  K 3.8 4.0 3.6 3.9  CL 104 107 111 110  CO2 26 26 23 24   GLUCOSE 115* 177* 118* 108*  BUN 26* 20 16 10   CREATININE 0.92 1.01* 0.92 0.83  CALCIUM 9.3 9.7 8.6* 8.3*  MG  --   --   --  2.1   GFR: Estimated Creatinine Clearance: 56.2 mL/min (by C-G formula based on SCr of 0.83 mg/dL). Liver Function Tests: Recent Labs  Lab 11/08/17 1632 11/09/17 1101 11/10/17 0629 11/11/17 0659  AST 22 222* 94* 56*  ALT 32 177* 152* 104*  ALKPHOS 76 109 74 81  BILITOT 0.6 3.1* 6.5* 4.3*  PROT 6.5 7.0 5.7* 5.4*  ALBUMIN 4.1 3.9 3.1* 2.7*   Recent Labs  Lab 11/09/17 1101  LIPASE 37   No results for input(s): AMMONIA in the last 168 hours. Coagulation Profile: No results for input(s): INR,  PROTIME in the last 168 hours. Cardiac Enzymes: No results for input(s): CKTOTAL, CKMB, CKMBINDEX, TROPONINI in the last 168 hours. BNP (last 3 results) No results for input(s): PROBNP in the last 8760 hours. HbA1C: No results for input(s): HGBA1C in the last 72 hours. CBG: No results for input(s): GLUCAP in the last 168 hours. Lipid Profile: No results for input(s): CHOL, HDL, LDLCALC, TRIG, CHOLHDL, LDLDIRECT in the last 72 hours. Thyroid Function Tests: No results for input(s): TSH, T4TOTAL, FREET4, T3FREE, THYROIDAB in the last 72 hours. Anemia Panel: No results for input(s): VITAMINB12, FOLATE, FERRITIN, TIBC, IRON, RETICCTPCT in the last 72 hours. Sepsis Labs: No results for input(s): PROCALCITON, LATICACIDVEN in the last 168 hours.  Recent Results (from the past 240 hour(s))  Surgical pcr screen     Status: None   Collection Time: 11/11/17  2:21 PM  Result Value Ref Range Status   MRSA, PCR NEGATIVE NEGATIVE Final  Staphylococcus aureus NEGATIVE NEGATIVE Final    Comment: (NOTE) The Xpert SA Assay (FDA approved for NASAL specimens in patients 39 years of age and older), is one component of a comprehensive surveillance program. It is not intended to diagnose infection nor to guide or monitor treatment. Performed at [facility], [place] [address] [place], [place] [address]          Radiology Studies: Dg Ercp Biliary & Pancreatic Ducts  Result Date: 11/11/2017 CLINICAL DATA:  ERCP EXAM: ERCP TECHNIQUE: Multiple spot images obtained with the fluoroscopic device and submitted for interpretation post-procedure. FLUOROSCOPY TIME:  Fluoroscopy Time:  1 minutes and 51 seconds Radiation Exposure Index (if provided by the fluoroscopic device): Number of Acquired Spot Images: 0 COMPARISON:  None. FINDINGS: Cannulation of the common bile duct is achieved. Contrast fills the biliary tree. A filling defect in the upper common bile duct is noted. Balloon stone retrieval is  documented. Final imaging demonstrates resolution of the duct stone. IMPRESSION: Successful balloon retrieval of a common bile duct stone. These images were submitted for radiologic interpretation only. Please see the procedural report for the amount of contrast and the fluoroscopy time utilized. Electronically Signed   By: Marybelle Killings M.D.   On: 11/11/2017 13:59        Scheduled Meds: . indomethacin  100 mg Rectal Once   Continuous Infusions: . sodium chloride 75 mL/hr at 11/11/17 2155  . cefTRIAXone (ROCEPHIN)  IV 2 g (11/11/17 0758)  . metronidazole 500 mg (11/12/17 0224)     LOS: 3 days    Time spent: over 38 min    Fayrene Helper, MD [facility] Pager [phone]  If 7PM-7AM, please contact night-coverage [email or web address] Password [facility] 11/12/2017, 7:31 AM

## 2017-11-13 ENCOUNTER — Encounter (HOSPITAL_COMMUNITY): Payer: Self-pay | Admitting: Surgery

## 2017-11-13 LAB — BASIC METABOLIC PANEL
Anion gap: 7 (ref 5–15)
BUN: 11 mg/dL (ref 6–20)
CALCIUM: 8.5 mg/dL — AB (ref 8.9–10.3)
CHLORIDE: 109 mmol/L (ref 101–111)
CO2: 26 mmol/L (ref 22–32)
CREATININE: 0.96 mg/dL (ref 0.44–1.00)
GFR calc Af Amer: >60 mL/min (ref >60)
GFR calc non Af Amer: 57 mL/min — ABNORMAL LOW (ref >60)
Glucose, Bld: 111 mg/dL — ABNORMAL HIGH (ref 65–99)
Potassium: 3.4 mmol/L — ABNORMAL LOW (ref 3.5–5.1)
SODIUM: 142 mmol/L (ref 135–145)

## 2017-11-13 LAB — CBC
HEMATOCRIT: 34 % — AB (ref 36.0–46.0)
Hemoglobin: 10.9 g/dL — ABNORMAL LOW (ref 12.0–15.0)
MCH: 29.9 pg (ref 26.0–34.0)
MCHC: 32.1 g/dL (ref 30.0–36.0)
MCV: 93.2 fL (ref 78.0–100.0)
Platelets: 168 10*3/uL (ref 150–400)
RBC: 3.65 MIL/uL — ABNORMAL LOW (ref 3.87–5.11)
RDW: 15.6 % — AB (ref 11.5–15.5)
WBC: 6.3 10*3/uL (ref 4.0–10.5)

## 2017-11-13 LAB — HEPATIC FUNCTION PANEL
ALBUMIN: 2.9 g/dL — AB (ref 3.5–5.0)
ALT: 102 U/L — ABNORMAL HIGH (ref 14–54)
AST: 52 U/L — ABNORMAL HIGH (ref 15–41)
Alkaline Phosphatase: 92 U/L (ref 38–126)
BILIRUBIN DIRECT: 0.7 mg/dL — AB (ref 0.1–0.5)
BILIRUBIN INDIRECT: 0.8 mg/dL (ref 0.3–0.9)
BILIRUBIN TOTAL: 1.5 mg/dL — AB (ref 0.3–1.2)
Total Protein: 5.4 g/dL — ABNORMAL LOW (ref 6.5–8.1)

## 2017-11-13 LAB — MAGNESIUM: MAGNESIUM: 1.9 mg/dL (ref 1.7–2.4)

## 2017-11-13 MED ORDER — HYDRALAZINE HCL 20 MG/ML IJ SOLN: 10.0000 mg | Freq: Four times a day (QID) | INTRAMUSCULAR | Status: DC | PRN

## 2017-11-13 MED ORDER — POTASSIUM CHLORIDE CRYS ER 20 MEQ PO TBCR
40.0000 meq | EXTENDED_RELEASE_TABLET | Freq: Once | ORAL | Status: AC
  Administered 2017-11-13: 40 meq via ORAL
  Filled 2017-11-13: qty 2

## 2017-11-13 MED ORDER — TRAMADOL HCL 50 MG PO TABS
50.0000 mg | ORAL_TABLET | Freq: Once | ORAL | Status: AC
  Administered 2017-11-13: 50 mg via ORAL
  Filled 2017-11-13: qty 1

## 2017-11-13 NOTE — Discharge Summary (Signed)
Physician Discharge Summary  Sandra Schwartz JQB:[id number] DOB: Jan 31, 1946 DOA: 11/09/2017  PCP: Hoyt Koch, MD  Admit date: 11/09/2017 Discharge date: 11/13/2017  Time spent: 35 minutes  Recommendations for Outpatient Follow-up:  1. Follow up outpatient CBC/CMP 2. Ensure follow up with surgery 3. Follow up with PCP 4. Follow up incidental imaging findings (adrenal nodule, subcentimeter cysts in liver/kidney) 5. Hepatic steatosis, follow outpatinet   Discharge Diagnoses:  Principal Problem:   Choledocholithiasis Active Problems:   COPD (chronic obstructive pulmonary disease) (HCC)   Hyperlipidemia   Rheumatoid arthritis (HCC)   Essential hypertension   Elevated transaminase level   Abnormal liver function   Discharge Condition: stable  Diet recommendation: heart healthy  Filed Weights   11/10/17 1949 11/11/17 0614 11/13/17 0541  Weight: 72.6 kg (160 lb) 75.8 kg (167 lb 1.7 oz) 82.4 kg (181 lb 10.5 oz)    History of present illness:  Sandra Schwartz a very pleasant72 y.o.femalewith medical history significantfor hypertension, GERD, rheumatoid arthritis, who presented to ED with nausea, vomiting, abdominal pain and was found to have cholecystitis and choledocholithiasis.    She had ERCP and cholecystectomy.  Improved and doing well at time of discharge.      Hospital Course:  #1. Choledocholithiasis  Cholecystitis:  [place] with dilated CBD, up to 14 mm with cholelithiasis as well.  MRCP with distended gallbladder with cholelithiasis and mild pericholecystic edema suggesting acute cholecystitis.  MRCP also showed findings c/w choledocholithiasis.  AST/ALT improved today, tbili improving. S/p ERCP on 6/20 with stones/sludge in CBD and L and R main hepatic ducts.  S/p biliary sphincterotomy and balloon extraction.  Avoid ASA and NSAIDs x 1 week. S/p cholecystectomy 6/21, doing well post op.  Will discontinue abx and d/c today.  #2. Elevated Liver Enzymes: improving  after ERCP and cholecystectomy, follow outpatient  # 3. Hypertension. follow outpatient   # 4 Rheumatoid arthritis. Appears stable and at baseline.  She tells me she's no longer taking prednisone.  Only methotrexate, holding this for now.    #5. COPD. Stable at baseline. Chest x-ray as noted above -continue home meds  # Hypokalemia: replete, follow outpatient  # Thrombocytopenia: follow, mild # Anemia: follow, mild, likely some hemodilution  Adrenal nodule c/w adenoma on imaging Subcentimeter cysts in liver and kidney  Procedures: Laparoscopic cholecystectomy 6/21 ERCP 6/20 Filling defects consistent with stones and sludge were noted on the cholangiogram. - The common bile duct was dilated, with stones, sludge causing an obstruction. - The left main hepatic duct and right main hepatic duct were dilated, with stones, sludge causing an obstruction. - Choledocholithiasis was found. Complete removal was accomplished by biliary sphincterotomy and balloon extraction. - A biliary sphincterotomy was performed. - The biliary tree was swept. - Periampullary diverticulum. - Avoid aspirin and nonsteroidal anti-inflammatory medicines for 1 week. - Return patient to hospital ward for ongoing care. - Trend LFTs. Recommendation: - Avoid aspirin and nonsteroidal anti-inflammatory medicines for 1 week. - Return patient to hospital ward for ongoing care. - Trend LFTs.  Consultations:  GI  Surgery  Discharge Exam: Vitals:   11/13/17 0445 11/13/17 1434  BP: (!) 156/71 140/67  Pulse: 64 67  Resp: 18 17  Temp:  98.6 F (37 C)  SpO2: 94% 97%   Feeling better.  Ready to go home.  Has had BM, passing gas.  General: No acute distress. Cardiovascular: Heart sounds show a regular rate, and rhythm. No gallops or rubs. No murmurs. No JVD. Lungs: Clear to auscultation bilaterally  with good air movement. No rales, rhonchi or wheezes. Abdomen: Soft, nontender, nondistended with normal  active bowel sounds. Laparoscopic incisions.  Neurological: Alert and oriented 3. Moves all extremities 4. Cranial nerves II through XII grossly intact. Skin: Warm and dry. No rashes or lesions. Extremities: No clubbing or cyanosis. No edema.  Psychiatric: Mood and affect are normal. Insight and judgment are appropriate.  Discharge Instructions   Discharge Instructions    Call MD for:  difficulty breathing, headache or visual disturbances   Complete by:  As directed    Call MD for:  extreme fatigue   Complete by:  As directed    Call MD for:  persistant dizziness or light-headedness   Complete by:  As directed    Call MD for:  persistant nausea and vomiting   Complete by:  As directed    Call MD for:  redness, tenderness, or signs of infection (pain, swelling, redness, odor or green/yellow discharge around incision site)   Complete by:  As directed    Call MD for:  severe uncontrolled pain   Complete by:  As directed    Call MD for:  temperature >100.4   Complete by:  As directed    Diet - low sodium heart healthy   Complete by:  As directed    Discharge instructions   Complete by:  As directed    You were seen for obstructing gallstones and cholecystitis (inflamed gallbladder).  You had an ERCP with GI where they removed the stones and sludge and then surgery removed your gallbladder.   Do not take aspirin or NSAIDs (medicines like ibuprofen or aleve) for 1 week.  Continue your tramadol as needed (this can be used for your post op pain).  Please follow up with your PCP and surgery.  Call next week to schedule these appointments.  You'll need to follow up labs with your PCP.  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  Please follow up these imaging findings with your PCP.   Return if you have new, recurrent, or worsening symptoms.  Please ask your PCP to request records from this hospitalization so  they know what was done and what the next steps will be.   Increase activity slowly   Complete by:  As directed      Allergies as of 11/13/2017      Reactions   Vit D-vit E-safflower Oil Shortness Of Breath, Nausea And Vomiting   Codeine Rash      Medication List    STOP taking these medications   predniSONE 2.5 MG tablet Commonly known as:  DELTASONE     TAKE these medications   albuterol (2.5 MG/3ML) 0.083% nebulizer solution Commonly known as:  PROVENTIL Take 3 mLs (2.5 mg total) by nebulization every 6 (six) hours as needed for wheezing or shortness of breath.   COD LIVER OIL PO Take 1 capsule by mouth daily.   folic acid 1 MG tablet Commonly known as:  FOLVITE TAKE 1 TABLET BY MOUTH EVERY DAY   HUMIRA 40 MG/0.8ML Pskt Generic drug:  Adalimumab Inject 40 mg into the skin every 14 (fourteen) days.   HYDROcodone-acetaminophen 5-325 MG tablet Commonly known as:  NORCO/VICODIN Take 1-2 tablets by mouth every 4 (four) hours as needed for moderate pain or severe pain.   methotrexate 2.5 MG tablet Commonly known as:  RHEUMATREX Take 5-7.5 mg by mouth See admin instructions. Take  5-7.5 mg by mouth two times a week- 5 mg on Sundays and 7.5 mg on Saturdays   pantoprazole 40 MG tablet Commonly known as:  PROTONIX TAKE 1 TABLET BY MOUTH EVERY DAY   traMADol 100 MG 24 hr tablet Commonly known as:  ULTRAM-ER Take 1 tablet (100 mg total) by mouth daily. What changed:    when to take this  reasons to take this   umeclidinium-vilanterol 62.5-25 MCG/INH Aepb Commonly known as:  ANORO ELLIPTA Inhale 1 puff into the lungs daily.      Allergies  Allergen Reactions  . Vit D-Vit E-Safflower Oil Shortness Of Breath and Nausea And Vomiting  . Codeine Rash   Follow-up Information    Hoyt Koch, MD Follow up.   Specialty:  Internal Medicine Why:  Please call for a follow up appointment Contact information: [facility] [place]  [id number] [phone]        Erroll Luna, MD Follow up.   Specialty:  General Surgery Why:  Please call for a follow up appointment Contact information: [place] [address] [phone]            The results of significant diagnostics from this hospitalization (including imaging, microbiology, ancillary and laboratory) are listed below for reference.    Significant Diagnostic Studies: Dg Chest 2 View  Result Date: 11/09/2017 CLINICAL DATA:  Chest pain.  Shortness of breath. EXAM: CHEST - 2 VIEW COMPARISON:  10/08/2017. FINDINGS: Mediastinum hilar structures normal. Low lung volumes with mild bibasilar atelectasis/infiltrates. No pleural effusion or pneumothorax. Biapical pleural thickening noted consistent scarring. No acute bony abnormality. IMPRESSION: Low lung volumes with mild bibasilar atelectasis/infiltrates. Electronically Signed   By: Marcello Moores  Register   On: 11/09/2017 13:51   US Abdomen Complete  Result Date: 11/09/2017 CLINICAL DATA:  72 year old female with acute abdominal pain. EXAM: ABDOMEN ULTRASOUND COMPLETE COMPARISON:  None. FINDINGS: Gallbladder: Positive for flat oval nonshadowing gallstones in the fundus (image 22) estimated up to 11 millimeters. Gallbladder wall thickness remains normal. Gallbladder size is at the upper limits of normal, but no pericholecystic fluid and no sonographic Murphy sign are noted. Common bile duct: Diameter: 14 millimeters diameter, severely dilated (image 34, normal up to 6 millimeters diameter). Liver: Mildly to moderately increased liver echogenicity (image 41). No intrahepatic biliary ductal dilatation is evident. No discrete liver lesion. Portal vein is patent on color Doppler imaging with normal direction of blood flow towards the liver. IVC: No abnormality visualized. Pancreas: Visualized portion unremarkable. Spleen: Size and appearance within normal limits. Right Kidney: Length: 9.7 centimeters.  Echogenicity within normal limits. No mass or hydronephrosis visualized. Left Kidney: Length: 7.1 centimeters, asymmetrically smaller. Echogenicity within normal limits. No mass or hydronephrosis visualized. Abdominal aorta: Incompletely visualized due to overlying bowel gas, visualized portions within normal limits. Other findings: None. IMPRESSION: 1. Severely dilated common bile duct, up to 14 mm. Superimposed cholelithiasis measuring about 11 mm. This constellation is suspicious for Acute Choledocholithiasis. 2. No evidence of acute cholecystitis. 3. Hepatic steatosis. Electronically Signed   By: Genevie Ann M.D.   On: 11/09/2017 15:13   Mr 3d Recon At Scanner  Result Date: 11/09/2017 CLINICAL DATA:  Abnormal liver functions. Worsening abdominal pain, nausea, and vomiting. Abdominal ultrasound demonstrated choledocholithiasis. EXAM: MRI ABDOMEN WITHOUT AND WITH CONTRAST (INCLUDING MRCP) TECHNIQUE: Multiplanar multisequence MR imaging of the abdomen was performed both before and after the administration of intravenous contrast. Heavily T2-weighted images of the biliary and pancreatic ducts were  obtained, and three-dimensional MRCP images were rendered by post processing. CONTRAST:  54mL MULTIHANCE GADOBENATE DIMEGLUMINE 529 MG/ML IV SOLN COMPARISON:  Ultrasound abdomen 11/09/2017 FINDINGS: Motion artifact limits examination. Lower chest: Lung bases are clear. Hepatobiliary: Liver demonstrates a few tiny subcentimeter T2 hyperintense lesions consistent with small cyst. No solid focal lesions identified. No enhancing liver lesions identified. The gallbladder is mildly distended with small stones in the gallbladder neck. There is mild pericholecystic edema. Changes suggest mild acute cholecystitis. There is mild intra and moderate extrahepatic bile duct dilatation with common bile duct measuring up to about 14 mm in diameter. Focal filling defects are demonstrated in the mid and distal common bile duct consistent  with common duct stones. Pancreas: No mass, inflammatory changes, or other parenchymal abnormality identified. Spleen:  Within normal limits in size and appearance. Adrenals/Urinary Tract: Left adrenal gland nodule measuring 2.4 cm maximal dimension. There is significant signal loss within the lesion between in and out of phase images consistent with fat content. Appearance is consistent with a benign adenoma. Subcentimeter parenchymal cysts on the left kidney. No hydronephrosis or hydroureter. Stomach/Bowel: Visualized stomach, small bowel, and colon demonstrate no evidence of dilatation, wall thickening, or inflammatory change. Vascular/Lymphatic: Normal caliber abdominal aorta. No aneurysm. Inferior vena cava is patent. No significant lymphadenopathy. Other: No free air or free fluid demonstrated in the visualized abdomen. Musculoskeletal: No suspicious bone lesions identified. IMPRESSION: 1. Distended gallbladder with cholelithiasis and mild pericholecystic edema suggesting acute cholecystitis. 2. Dilated intra and extrahepatic bile ducts with filling defects in the mid and distal common bile duct consistent with choledocholithiasis. 3. Left adrenal gland nodule has imaging characteristics consistent with benign adenoma. 4. Small subcentimeter cysts in the liver and left kidney. Electronically Signed   By: Lucienne Capers M.D.   On: 11/09/2017 22:00   Dg Ercp Biliary & Pancreatic Ducts  Result Date: 11/11/2017 CLINICAL DATA:  ERCP EXAM: ERCP TECHNIQUE: Multiple spot images obtained with the fluoroscopic device and submitted for interpretation post-procedure. FLUOROSCOPY TIME:  Fluoroscopy Time:  1 minutes and 51 seconds Radiation Exposure Index (if provided by the fluoroscopic device): Number of Acquired Spot Images: 0 COMPARISON:  None. FINDINGS: Cannulation of the common bile duct is achieved. Contrast fills the biliary tree. A filling defect in the upper common bile duct is noted. Balloon stone retrieval  is documented. Final imaging demonstrates resolution of the duct stone. IMPRESSION: Successful balloon retrieval of a common bile duct stone. These images were submitted for radiologic interpretation only. Please see the procedural report for the amount of contrast and the fluoroscopy time utilized. Electronically Signed   By: Marybelle Killings M.D.   On: 11/11/2017 13:59   Mr Abdomen Mrcp Moise Boring Contast  Result Date: 11/09/2017 CLINICAL DATA:  Abnormal liver functions. Worsening abdominal pain, nausea, and vomiting. Abdominal ultrasound demonstrated choledocholithiasis. EXAM: MRI ABDOMEN WITHOUT AND WITH CONTRAST (INCLUDING MRCP) TECHNIQUE: Multiplanar multisequence MR imaging of the abdomen was performed both before and after the administration of intravenous contrast. Heavily T2-weighted images of the biliary and pancreatic ducts were obtained, and three-dimensional MRCP images were rendered by post processing. CONTRAST:  75mL MULTIHANCE GADOBENATE DIMEGLUMINE 529 MG/ML IV SOLN COMPARISON:  Ultrasound abdomen 11/09/2017 FINDINGS: Motion artifact limits examination. Lower chest: Lung bases are clear. Hepatobiliary: Liver demonstrates a few tiny subcentimeter T2 hyperintense lesions consistent with small cyst. No solid focal lesions identified. No enhancing liver lesions identified. The gallbladder is mildly distended with small stones in the gallbladder neck. There is mild pericholecystic edema.  Changes suggest mild acute cholecystitis. There is mild intra and moderate extrahepatic bile duct dilatation with common bile duct measuring up to about 14 mm in diameter. Focal filling defects are demonstrated in the mid and distal common bile duct consistent with common duct stones. Pancreas: No mass, inflammatory changes, or other parenchymal abnormality identified. Spleen:  Within normal limits in size and appearance. Adrenals/Urinary Tract: Left adrenal gland nodule measuring 2.4 cm maximal dimension. There is  significant signal loss within the lesion between in and out of phase images consistent with fat content. Appearance is consistent with a benign adenoma. Subcentimeter parenchymal cysts on the left kidney. No hydronephrosis or hydroureter. Stomach/Bowel: Visualized stomach, small bowel, and colon demonstrate no evidence of dilatation, wall thickening, or inflammatory change. Vascular/Lymphatic: Normal caliber abdominal aorta. No aneurysm. Inferior vena cava is patent. No significant lymphadenopathy. Other: No free air or free fluid demonstrated in the visualized abdomen. Musculoskeletal: No suspicious bone lesions identified. IMPRESSION: 1. Distended gallbladder with cholelithiasis and mild pericholecystic edema suggesting acute cholecystitis. 2. Dilated intra and extrahepatic bile ducts with filling defects in the mid and distal common bile duct consistent with choledocholithiasis. 3. Left adrenal gland nodule has imaging characteristics consistent with benign adenoma. 4. Small subcentimeter cysts in the liver and left kidney. Electronically Signed   By: Lucienne Capers M.D.   On: 11/09/2017 22:00   Mm 3d Screen Breast Bilateral  Result Date: 10/25/2017 CLINICAL DATA:  Screening. EXAM: DIGITAL SCREENING BILATERAL MAMMOGRAM WITH TOMO AND CAD COMPARISON:  Previous exam(s). ACR Breast Density Category b: There are scattered areas of fibroglandular density. FINDINGS: There are no findings suspicious for malignancy. Images were processed with CAD. IMPRESSION: No mammographic evidence of malignancy. A result letter of this screening mammogram will be mailed directly to the patient. RECOMMENDATION: Screening mammogram in one year. (Code:SM-B-01Y) BI-RADS CATEGORY  1: Negative. Electronically Signed   By: Franki Cabot M.D.   On: 10/25/2017 14:25    Microbiology: Recent Results (from the past 240 hour(s))  Surgical pcr screen     Status: None   Collection Time: 11/11/17  2:21 PM  Result Value Ref Range Status    MRSA, PCR NEGATIVE NEGATIVE Final   Staphylococcus aureus NEGATIVE NEGATIVE Final    Comment: (NOTE) The Xpert SA Assay (FDA approved for NASAL specimens in patients 10 years of age and older), is one component of a comprehensive surveillance program. It is not intended to diagnose infection nor to guide or monitor treatment. Performed at [facility], [place] [address], [place], [place] [address]      Labs: Basic Metabolic Panel: Recent Labs  Lab 11/09/17 1101 11/10/17 0629 11/11/17 0659 11/12/17 0750 11/13/17 0523  NA 142 140 140 141 142  K 4.0 3.6 3.9 3.3* 3.4*  CL 107 111 110 111 109  CO2 26 23 24 24 26   GLUCOSE 177* 118* 108* 128* 111*  BUN 20 16 10 8 11   CREATININE 1.01* 0.92 0.83 0.76 0.96  CALCIUM 9.7 8.6* 8.3* 8.2* 8.5*  MG  --   --  2.1 2.0 1.9   Liver Function Tests: Recent Labs  Lab 11/09/17 1101 11/10/17 0629 11/11/17 0659 11/12/17 0750 11/13/17 0523  AST 222* 94* 56* 97* 52*  ALT 177* 152* 104* 113* 102*  ALKPHOS 109 74 81 94 92  BILITOT 3.1* 6.5* 4.3* 2.8* 1.5*  PROT 7.0 5.7* 5.4* 5.5* 5.4*  ALBUMIN 3.9 3.1* 2.7* 2.7* 2.9*   Recent Labs  Lab 11/09/17 1101  LIPASE 37  No results for input(s): AMMONIA in the last 168 hours. CBC: Recent Labs  Lab 11/09/17 1101 11/10/17 0629 11/11/17 0659 11/12/17 0750 11/13/17 0523  WBC 9.6 9.3 5.5 4.0 6.3  HGB 13.2 11.2* 10.3* 10.3* 10.9*  HCT 41.5 34.5* 32.9* 31.8* 34.0*  MCV 93.7 93.2 93.5 92.2 93.2  PLT 178 149* 133* 135* 168   Cardiac Enzymes: No results for input(s): CKTOTAL, CKMB, CKMBINDEX, TROPONINI in the last 168 hours. BNP: BNP (last 3 results) Recent Labs    10/08/17 1921  BNP 59.6    ProBNP (last 3 results) No results for input(s): PROBNP in the last 8760 hours.  CBG: No results for input(s): GLUCAP in the last 168 hours.     Signed:  Fayrene Helper MD.  [facility] 11/13/2017, 7:25 PM

## 2017-11-13 NOTE — Discharge Instructions (Signed)
[facility] - DISCHARGE INSTRUCTIONS TO PATIENT  Activity:  Driving - May drive in 3 to 4 days, if doing well   Lifting - No lifting more than 15 pounds for one week, then no limit  Wound Care:   May start showering tomorrow  Diet:  As tolerated  Follow up appointment:  Call Dr. Josetta Huddle office [facility]) at [phone] for an appointment in 2 to 4 weeks  Medications and dosages:  Resume your home medications.  Call Dr. Brantley Stage or his office  [phone]) if you have:  Temperature greater than 100.4,  Persistent nausea and vomiting,  Redness, tenderness, or signs of infection (pain, swelling, redness, odor or green/yellow discharge around the site),  Any other questions or concerns you may have after discharge.  In an emergency, call 911 or go to an Emergency Department at a nearby hospital.

## 2017-11-13 NOTE — Progress Notes (Signed)
Discharge instructions to Pt and her family. Pt aware of when she is to return to MD, to phone MD on Monday and schedule appointment.  Pt to leave with her daughter and son in law. Discharge instruction and education provided to family. Pt discharged to home with her family as stated above. Pt to be take to front via w/c

## 2017-11-13 NOTE — Progress Notes (Signed)
[facility] Office:  [phone] General Surgery Progress Note   LOS: 4 days  POD -  1 Day Post-Op  Chief Complaint: Abdominal pain  Assessment and Plan: 1.  LAPAROSCOPIC CHOLECYSTECTOMY - 11/12/2017 - [place]  Rocephin/Flagyl  Looks good - okay to go home  She does not need antibiotics for discharge from our standpoint.  She has Tramadol at home for arthritis, this will work for post op pain 2.  ERCP - 11/11/2017 - [place] Plan 3.  HTN 4.  COPD 5.  RA - Dr. Meda Coffee - rheumatology  [facility])   Principal Problem:   Choledocholithiasis Active Problems:   COPD (chronic obstructive pulmonary disease) (HCC)   Hyperlipidemia   Rheumatoid arthritis ([place])   Essential hypertension   Elevated transaminase level   Abnormal liver function   Subjective:  Looks good.  Not hurting much.  Lives with daughter.  She is widowed.  Objective:   Vitals:   11/13/17 0202 11/13/17 0445  BP: (!) 155/96 (!) 156/71  Pulse: 75 64  Resp: 12 18  Temp: 98.4 F (36.9 C)   SpO2: 94% 94%     Intake/Output from previous day:  06/21 0701 - 06/22 0700 In: 3121.6 [P.O.:220; I.V.:2069.9; IV Piggyback:831.7] Out: 5 [Blood:5]  Intake/Output this shift:  No intake/output data recorded.   Physical Exam:   General: Older WF who is alert and oriented.    HEENT: Normal. Pupils equal. .   Lungs: Clear   Abdomen: Soft   Wound: Clean   Lab Results:    Recent Labs    11/12/17 0750 11/13/17 0523  WBC 4.0 6.3  HGB 10.3* 10.9*  HCT 31.8* 34.0*  PLT 135* 168    BMET   Recent Labs    11/12/17 0750 11/13/17 0523  NA 141 142  K 3.3* 3.4*  CL 111 109  CO2 24 26  GLUCOSE 128* 111*  BUN 8 11  CREATININE 0.76 0.96  CALCIUM 8.2* 8.5*    PT/INR  No results for input(s): LABPROT, INR in the last 72 hours.  ABG  No results for input(s): PHART, HCO3 in the last 72 hours.  Invalid input(s): PCO2, PO2   Studies/Results:  Dg Ercp Biliary & Pancreatic Ducts  Result Date:  11/11/2017 CLINICAL DATA:  ERCP EXAM: ERCP TECHNIQUE: Multiple spot images obtained with the fluoroscopic device and submitted for interpretation post-procedure. FLUOROSCOPY TIME:  Fluoroscopy Time:  1 minutes and 51 seconds Radiation Exposure Index (if provided by the fluoroscopic device): Number of Acquired Spot Images: 0 COMPARISON:  None. FINDINGS: Cannulation of the common bile duct is achieved. Contrast fills the biliary tree. A filling defect in the upper common bile duct is noted. Balloon stone retrieval is documented. Final imaging demonstrates resolution of the duct stone. IMPRESSION: Successful balloon retrieval of a common bile duct stone. These images were submitted for radiologic interpretation only. Please see the procedural report for the amount of contrast and the fluoroscopy time utilized. Electronically Signed   By: Marybelle Killings M.D.   On: 11/11/2017 13:59     Anti-infectives:   Anti-infectives (From admission, onward)   Start     Dose/Rate Route Frequency Ordered Stop   11/10/17 0730  cefTRIAXone (ROCEPHIN) 2 g in sodium chloride 0.9 % 100 mL IVPB     2 g 200 mL/hr over 30 Minutes Intravenous Every 24 hours 11/10/17 0727     11/09/17 1800  metroNIDAZOLE (FLAGYL) IVPB 500 mg     500 mg 100 mL/hr over 60 Minutes  Intravenous Every 8 hours 11/09/17 1721     11/09/17 1745  piperacillin-tazobactam (ZOSYN) IVPB 3.375 g     3.375 g 100 mL/hr over 30 Minutes Intravenous  Once 11/09/17 1740 11/09/17 1856      Alphonsa Overall, MD, FACS Pager: [facility] Office: [phone] 11/13/2017

## 2017-11-15 ENCOUNTER — Telehealth: Payer: Self-pay | Admitting: *Deleted

## 2017-11-15 NOTE — Telephone Encounter (Signed)
Called pt/daughter [name]) to make hosp f/u appt no answer LMOM RTC.Marland KitchenJohny Chess

## 2017-11-16 NOTE — Telephone Encounter (Signed)
Pt/daughter called back hosp f/u appt was made by [facility] center. Called pt/daughter back to complete TCM report inform daughter had some additional questions concerning discharge. Completed TCM below.Sandra Schwartz  Transition Care Management Follow-up Telephone Call   Date discharged?  11/13/17   How have you been since you were released from the hospital? Per daughter Sandra Schwartz) not with mom right now, but she has been doing alright.    Do you understand why you were in the hospital? YES   Do you understand the discharge instructions? YES   Where were you discharged to? Home    Items Reviewed:  Medications reviewed: YES  Allergies reviewed: YES  Dietary changes reviewed: YES, heart healthy  Referrals reviewed: No referral needed   Functional Questionnaire:   Activities of Daily Living (ADLs):   She states she are independent in the following: bathing and hygiene, feeding, continence, grooming, toileting and dressing States they require assistance with the following: ambulation   Any transportation issues/concerns?: NO  Any patient concerns? NO   Confirmed importance and date/time of follow-up visits scheduled YES, appt 11/17/17  Provider Appointment booked with Dr. Sharlet Salina  Confirmed with patient if condition begins to worsen call PCP or go to the ER.  Patient was given the office number and encouraged to call back with question or concerns.  : YES

## 2017-11-16 NOTE — Telephone Encounter (Signed)
I have scheduled appt for 11/17/17.

## 2017-11-17 ENCOUNTER — Inpatient Hospital Stay: Payer: Medicare Other | Admitting: Internal Medicine

## 2017-11-17 NOTE — Telephone Encounter (Signed)
Per chart pt cancel and reschedule hosp f/u until 11/26/17...Sandra Schwartz

## 2017-11-26 ENCOUNTER — Encounter: Payer: Self-pay | Admitting: Internal Medicine

## 2017-11-26 ENCOUNTER — Ambulatory Visit (INDEPENDENT_AMBULATORY_CARE_PROVIDER_SITE_OTHER): Payer: Medicare Other | Admitting: Internal Medicine

## 2017-11-26 VITALS — BP 120/84 | HR 75 | Temp 98.3°F | Ht 61.0 in | Wt 156.0 lb

## 2017-11-26 DIAGNOSIS — K8050 Calculus of bile duct without cholangitis or cholecystitis without obstruction: Secondary | ICD-10-CM

## 2017-11-26 MED ORDER — ALPRAZOLAM 0.25 MG PO TABS
0.2500 mg | ORAL_TABLET | Freq: Every day | ORAL | 0 refills | Status: AC | PRN
Start: 2017-11-26 — End: ?

## 2017-11-26 NOTE — Progress Notes (Signed)
   Subjective:    Patient ID: Sandra Schwartz, female    DOB: [birth year], 72 y.o.   MRN: [id number]  HPI The patient is a 72 YO female coming in for hospital follow up (in for cholecystitis and gallbladder removal, on antibiotics in the hospital). She is doing well since leaving the hospital. Her stomach is healing well. She denies ongoing stomach pain. She denies fevers or chills. Appetite is good. Denies nausea or vomiting. Denies diarrhea or loose stools. Is having some anxiety afterwards given the procedures. She denies arthritis flare. Is not taking vicodin any longer.   Review of Systems  Constitutional: Positive for activity change. Negative for appetite change, diaphoresis, fatigue, fever and unexpected weight change.  HENT: Negative.   Eyes: Negative.   Respiratory: Negative for cough, chest tightness and shortness of breath.   Cardiovascular: Negative for chest pain, palpitations and leg swelling.  Gastrointestinal: Negative for abdominal distention, abdominal pain, constipation, diarrhea, nausea and vomiting.  Musculoskeletal: Negative.   Skin: Negative.   Neurological: Negative.   Psychiatric/Behavioral: Positive for sleep disturbance. Negative for behavioral problems, confusion, decreased concentration and dysphoric mood. The patient is nervous/anxious.       Objective:   Physical Exam  Constitutional: She is oriented to person, place, and time. She appears well-developed and well-nourished.  HENT:  Head: Normocephalic and atraumatic.  Eyes: EOM are normal.  Neck: Normal range of motion.  Cardiovascular: Normal rate and regular rhythm.  Pulmonary/Chest: Effort normal and breath sounds normal. No respiratory distress. She has no wheezes. She has no rales.  Abdominal: Soft. Bowel sounds are normal. She exhibits no distension. There is no tenderness. There is no rebound.  4 sites intact and healing well, no signs of infection.   Musculoskeletal: She exhibits no edema.    Neurological: She is alert and oriented to person, place, and time. Coordination normal.  Skin: Skin is warm and dry.  Psychiatric: She has a normal mood and affect.   Vitals:   11/26/17 1311  BP: 120/84  Pulse: 75  Temp: 98.3 F (36.8 C)  TempSrc: Oral  SpO2: 97%  Weight: 156 lb (70.8 kg)  Height: 5\' 1"  (1.549 m)      Assessment & Plan:

## 2017-11-26 NOTE — Assessment & Plan Note (Addendum)
S/P cholecystectomy and doing well. Wounds appear to be healing nicely. Talked to her about lifting restrictions. No dietary restrictions although we talked about how fatty foods may bother her stomach without gallbladder. Rx for small amount xanax for relative PTSD. Talked to her about meditation, journaling, deep breathing exercises.

## 2017-11-26 NOTE — Patient Instructions (Addendum)
We have sent in the xanax to take 1/2 to 1 pill daily if needed. This is not a long term medicine and we want you to take it when needed.

## 2017-11-30 ENCOUNTER — Other Ambulatory Visit: Payer: Self-pay | Admitting: Internal Medicine

## 2017-11-30 DIAGNOSIS — Z9049 Acquired absence of other specified parts of digestive tract: Secondary | ICD-10-CM | POA: Diagnosis not present

## 2017-12-14 ENCOUNTER — Other Ambulatory Visit: Payer: Self-pay | Admitting: Internal Medicine

## 2017-12-20 ENCOUNTER — Telehealth: Payer: Self-pay

## 2017-12-20 DIAGNOSIS — Z1211 Encounter for screening for malignant neoplasm of colon: Secondary | ICD-10-CM

## 2017-12-20 NOTE — Telephone Encounter (Signed)
Referral placed. I assume [facility] means [facility]?

## 2017-12-20 NOTE — Telephone Encounter (Signed)
LVM for patient to call back to inform them that the referral has been placed for GI and they will call her to set up the appointment

## 2017-12-20 NOTE — Addendum Note (Signed)
Addended by: Pricilla Holm A on: 12/20/2017 02:38 PM   Modules accepted: Orders

## 2017-12-20 NOTE — Telephone Encounter (Signed)
Copied from [place] [phone]. Topic: Referral - Request >> Dec 20, 2017 10:21 AM Nils Flack wrote: Reason for CRM:  Pt got auto call for colonoscopy She needs referral to go to gi - she prefers [facility], this will be her first colonoscopy her she says. Please call [phone]

## 2017-12-24 DIAGNOSIS — Z79899 Other long term (current) drug therapy: Secondary | ICD-10-CM | POA: Diagnosis not present

## 2017-12-24 DIAGNOSIS — M2042 Other hammer toe(s) (acquired), left foot: Secondary | ICD-10-CM | POA: Diagnosis not present

## 2017-12-24 DIAGNOSIS — R748 Abnormal levels of other serum enzymes: Secondary | ICD-10-CM | POA: Diagnosis not present

## 2017-12-24 DIAGNOSIS — M2041 Other hammer toe(s) (acquired), right foot: Secondary | ICD-10-CM | POA: Diagnosis not present

## 2017-12-24 DIAGNOSIS — M059 Rheumatoid arthritis with rheumatoid factor, unspecified: Secondary | ICD-10-CM | POA: Diagnosis not present

## 2018-01-10 ENCOUNTER — Other Ambulatory Visit: Payer: Self-pay | Admitting: Nurse Practitioner

## 2018-01-18 DIAGNOSIS — E559 Vitamin D deficiency, unspecified: Secondary | ICD-10-CM | POA: Diagnosis not present

## 2018-01-18 DIAGNOSIS — R7989 Other specified abnormal findings of blood chemistry: Secondary | ICD-10-CM | POA: Diagnosis not present

## 2018-01-25 ENCOUNTER — Encounter: Payer: Self-pay | Admitting: Internal Medicine

## 2018-02-07 ENCOUNTER — Other Ambulatory Visit: Payer: Self-pay | Admitting: Nurse Practitioner

## 2018-03-08 ENCOUNTER — Other Ambulatory Visit: Payer: Self-pay | Admitting: Internal Medicine

## 2018-04-01 ENCOUNTER — Other Ambulatory Visit: Payer: Self-pay | Admitting: Internal Medicine

## 2018-04-27 DIAGNOSIS — Z79899 Other long term (current) drug therapy: Secondary | ICD-10-CM | POA: Diagnosis not present

## 2018-04-27 DIAGNOSIS — Z23 Encounter for immunization: Secondary | ICD-10-CM | POA: Diagnosis not present

## 2018-04-27 DIAGNOSIS — M2042 Other hammer toe(s) (acquired), left foot: Secondary | ICD-10-CM | POA: Diagnosis not present

## 2018-04-27 DIAGNOSIS — M2041 Other hammer toe(s) (acquired), right foot: Secondary | ICD-10-CM | POA: Diagnosis not present

## 2018-04-27 DIAGNOSIS — M059 Rheumatoid arthritis with rheumatoid factor, unspecified: Secondary | ICD-10-CM | POA: Diagnosis not present

## 2018-06-14 ENCOUNTER — Ambulatory Visit: Payer: Medicare Other

## 2018-07-02 ENCOUNTER — Other Ambulatory Visit: Payer: Self-pay | Admitting: Internal Medicine

## 2018-07-18 DIAGNOSIS — R0689 Other abnormalities of breathing: Secondary | ICD-10-CM | POA: Diagnosis not present

## 2018-07-18 DIAGNOSIS — Z885 Allergy status to narcotic agent status: Secondary | ICD-10-CM | POA: Diagnosis not present

## 2018-07-18 DIAGNOSIS — Z79899 Other long term (current) drug therapy: Secondary | ICD-10-CM | POA: Diagnosis not present

## 2018-07-18 DIAGNOSIS — J441 Chronic obstructive pulmonary disease with (acute) exacerbation: Secondary | ICD-10-CM | POA: Diagnosis not present

## 2018-07-18 DIAGNOSIS — R05 Cough: Secondary | ICD-10-CM | POA: Diagnosis not present

## 2018-07-25 ENCOUNTER — Ambulatory Visit (INDEPENDENT_AMBULATORY_CARE_PROVIDER_SITE_OTHER): Payer: Medicare Other | Admitting: Internal Medicine

## 2018-07-25 ENCOUNTER — Encounter: Payer: Self-pay | Admitting: Internal Medicine

## 2018-07-25 DIAGNOSIS — J441 Chronic obstructive pulmonary disease with (acute) exacerbation: Secondary | ICD-10-CM

## 2018-07-25 NOTE — Progress Notes (Signed)
   Subjective:   Patient ID: Sandra Schwartz, female    DOB: [birth year], 73 y.o.   MRN: [id number]  HPI The patient is a 73 YO female coming in for ER follow up (in for bronchitis and given levaquin 10 day course and prednisone 13 day course and hycodan cough syrup and duoneb to do QID, overall is improving slightly, had mild diarrhea initially but overall okay, did have nausea severe with cough medicine). 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 She is overall improving. She is now able to sleep without coughing as much. She is still having mild SOB with exertion. Sill tired. Is still doing the antibiotic, steroids and nebulizer 4 times per day. Denies worsening. Still taking all the medications except cough medicine. She did not want to take that anymore given the nausea with it. She did have a CXR done in the ER which she states was clear. Those records are not available for review as she was seen in [place]. She states flu testing was done in the ER and negative.  PMH, [facility], social history reviewed and updated   Review of Systems  Constitutional: Positive for activity change, appetite change and fatigue. Negative for chills, fever and unexpected weight change.  HENT: Negative.   Eyes: Negative.   Respiratory: Positive for cough. Negative for chest tightness and shortness of breath.   Cardiovascular: Negative for chest pain, palpitations and leg swelling.  Gastrointestinal: Negative for abdominal distention, abdominal pain, constipation, diarrhea, nausea and vomiting.  Musculoskeletal: Negative.   Skin: Negative.   Neurological: Negative.   Psychiatric/Behavioral: Negative.     Objective:  Physical Exam Constitutional:      Appearance: She is well-developed.  HENT:     Head: Normocephalic and atraumatic.  Neck:     Musculoskeletal: Normal range of motion.  Cardiovascular:     Rate and Rhythm: Normal rate and regular rhythm.  Pulmonary:     Effort: Pulmonary effort is  normal. No respiratory distress.     Breath sounds: Normal breath sounds. No wheezing or rales.  Abdominal:     General: Bowel sounds are normal. There is no distension.     Palpations: Abdomen is soft.     Tenderness: There is no abdominal tenderness. There is no rebound.  Skin:    General: Skin is warm and dry.  Neurological:     Mental Status: She is alert and oriented to person, place, and time.     Coordination: Coordination normal.     Vitals:   07/25/18 1049  BP: 140/90  Pulse: 67  Temp: 97.7 F (36.5 C)  TempSrc: Oral  SpO2: 97%  Weight: 158 lb (71.7 kg)  Height: 5\' 1"  (1.549 m)    Assessment & Plan:

## 2018-07-25 NOTE — Patient Instructions (Signed)
We will have you go down to 3 breathing treatments per day and then 3-4 days later if you are still doing well with breathing go down to 2 treatments per day.   If you continue to do well then decrease to 1 daily and after another 3-4 days you can stop altogether and use the breathing treatment only if needed.   You can still use the breathing medicine if you are getting short of breath or problems breathing.

## 2018-07-25 NOTE — Assessment & Plan Note (Signed)
With recent flare although with negative CXR the regimen was very aggressive. Advised to finish antibiotics and steroids. Advised to wean down nebulizer to TID and then BID and then daily and then only prn. She does have plenty of nebulizer solution at home. Exposed to nicotine in the home but current non-smoker and she is aware that this is contributing to her symptoms. Takes anoro and will continue this.

## 2018-08-29 DIAGNOSIS — M059 Rheumatoid arthritis with rheumatoid factor, unspecified: Secondary | ICD-10-CM | POA: Diagnosis not present

## 2018-08-31 DIAGNOSIS — Z79899 Other long term (current) drug therapy: Secondary | ICD-10-CM | POA: Diagnosis not present

## 2018-08-31 DIAGNOSIS — M059 Rheumatoid arthritis with rheumatoid factor, unspecified: Secondary | ICD-10-CM | POA: Diagnosis not present

## 2018-10-04 ENCOUNTER — Other Ambulatory Visit: Payer: Self-pay | Admitting: Internal Medicine

## 2018-10-07 ENCOUNTER — Other Ambulatory Visit: Payer: Self-pay | Admitting: Internal Medicine

## 2018-11-17 DIAGNOSIS — Z86010 Personal history of colonic polyps: Secondary | ICD-10-CM | POA: Diagnosis not present

## 2018-12-12 ENCOUNTER — Other Ambulatory Visit: Admission: RE | Admit: 2018-12-12 | Payer: Medicare Other | Source: Ambulatory Visit

## 2018-12-13 ENCOUNTER — Other Ambulatory Visit: Payer: Self-pay

## 2018-12-13 ENCOUNTER — Other Ambulatory Visit
Admission: RE | Admit: 2018-12-13 | Discharge: 2018-12-13 | Disposition: A | Discharge status: Home / Self Care | Payer: Medicare Other | Source: Ambulatory Visit | Attending: Gastroenterology | Admitting: Gastroenterology

## 2018-12-13 DIAGNOSIS — Z1159 Encounter for screening for other viral diseases: Secondary | ICD-10-CM | POA: Insufficient documentation

## 2018-12-13 LAB — SARS CORONAVIRUS 2 (TAT 6-24 HRS): SARS Coronavirus 2: NEGATIVE

## 2018-12-15 ENCOUNTER — Encounter: Payer: Self-pay | Admitting: *Deleted

## 2018-12-16 ENCOUNTER — Other Ambulatory Visit: Payer: Self-pay

## 2018-12-16 ENCOUNTER — Encounter
Admission: RE | Disposition: A | Discharge status: Home / Self Care | Payer: Self-pay | Source: Home / Self Care | Attending: Gastroenterology

## 2018-12-16 ENCOUNTER — Ambulatory Visit
Admission: RE | Admit: 2018-12-16 | Discharge: 2018-12-16 | Disposition: A | Discharge status: Home / Self Care | Payer: Medicare Other | Attending: Gastroenterology | Admitting: Gastroenterology

## 2018-12-16 ENCOUNTER — Ambulatory Visit: Payer: Medicare Other | Admitting: Anesthesiology

## 2018-12-16 ENCOUNTER — Encounter: Payer: Self-pay | Admitting: Emergency Medicine

## 2018-12-16 DIAGNOSIS — Z1211 Encounter for screening for malignant neoplasm of colon: Secondary | ICD-10-CM | POA: Insufficient documentation

## 2018-12-16 DIAGNOSIS — Z980 Intestinal bypass and anastomosis status: Secondary | ICD-10-CM | POA: Diagnosis not present

## 2018-12-16 DIAGNOSIS — K219 Gastro-esophageal reflux disease without esophagitis: Secondary | ICD-10-CM | POA: Insufficient documentation

## 2018-12-16 DIAGNOSIS — Z9049 Acquired absence of other specified parts of digestive tract: Secondary | ICD-10-CM | POA: Insufficient documentation

## 2018-12-16 DIAGNOSIS — D125 Benign neoplasm of sigmoid colon: Secondary | ICD-10-CM | POA: Diagnosis not present

## 2018-12-16 DIAGNOSIS — Z87891 Personal history of nicotine dependence: Secondary | ICD-10-CM | POA: Diagnosis not present

## 2018-12-16 DIAGNOSIS — I10 Essential (primary) hypertension: Secondary | ICD-10-CM | POA: Diagnosis not present

## 2018-12-16 DIAGNOSIS — D122 Benign neoplasm of ascending colon: Secondary | ICD-10-CM | POA: Diagnosis not present

## 2018-12-16 DIAGNOSIS — K5730 Diverticulosis of large intestine without perforation or abscess without bleeding: Secondary | ICD-10-CM | POA: Diagnosis not present

## 2018-12-16 DIAGNOSIS — J439 Emphysema, unspecified: Secondary | ICD-10-CM | POA: Insufficient documentation

## 2018-12-16 DIAGNOSIS — Z79899 Other long term (current) drug therapy: Secondary | ICD-10-CM | POA: Insufficient documentation

## 2018-12-16 DIAGNOSIS — D124 Benign neoplasm of descending colon: Secondary | ICD-10-CM | POA: Diagnosis not present

## 2018-12-16 DIAGNOSIS — K635 Polyp of colon: Secondary | ICD-10-CM | POA: Diagnosis not present

## 2018-12-16 DIAGNOSIS — Z800 Family history of malignant neoplasm of digestive organs: Secondary | ICD-10-CM | POA: Insufficient documentation

## 2018-12-16 DIAGNOSIS — Z86010 Personal history of colonic polyps: Secondary | ICD-10-CM | POA: Diagnosis not present

## 2018-12-16 DIAGNOSIS — K5790 Diverticulosis of intestine, part unspecified, without perforation or abscess without bleeding: Secondary | ICD-10-CM | POA: Diagnosis not present

## 2018-12-16 HISTORY — PX: COLONOSCOPY: SHX5424

## 2018-12-16 SURGERY — COLONOSCOPY
Anesthesia: General

## 2018-12-16 MED ORDER — LIDOCAINE HCL (PF) 2 % IJ SOLN
INTRAMUSCULAR | Status: DC | PRN
  Administered 2018-12-16: 60 mg

## 2018-12-16 MED ORDER — FENTANYL CITRATE (PF) 100 MCG/2ML IJ SOLN
INTRAMUSCULAR | Status: DC | PRN
  Administered 2018-12-16 (×2): 25 ug via INTRAVENOUS
  Administered 2018-12-16: 50 ug via INTRAVENOUS

## 2018-12-16 MED ORDER — PROPOFOL 10 MG/ML IV BOLUS
INTRAVENOUS | Status: DC | PRN
  Administered 2018-12-16: 30 mg via INTRAVENOUS
  Administered 2018-12-16: 20 mg via INTRAVENOUS

## 2018-12-16 MED ORDER — LIDOCAINE HCL (PF) 2 % IJ SOLN
INTRAMUSCULAR | Status: AC
  Filled 2018-12-16: qty 10

## 2018-12-16 MED ORDER — PROPOFOL 500 MG/50ML IV EMUL
INTRAVENOUS | Status: DC | PRN
  Administered 2018-12-16: 50 ug/kg/min via INTRAVENOUS

## 2018-12-16 MED ORDER — FENTANYL CITRATE (PF) 100 MCG/2ML IJ SOLN
INTRAMUSCULAR | Status: AC
  Filled 2018-12-16: qty 2

## 2018-12-16 MED ORDER — SODIUM CHLORIDE 0.9 % IV SOLN
INTRAVENOUS | Status: DC
  Administered 2018-12-16: 1000 mL via INTRAVENOUS

## 2018-12-16 NOTE — H&P (Signed)
Outpatient short stay form Pre-procedure 12/16/2018 1:13 PM Sandra Sails MD  Primary Physician: Dr. Pricilla Holm  Reason for visit: Colonoscopy  History of present illness: Patient is a 73 year old female sending today for colonoscopy in regards to her personal history of adenomatous colon polyps and family history of colon cancer in 2 primary relatives, father and brother.  Patient's last colonoscopy was about 6 years ago.  He denies any problems with abdominal pain diarrhea or rectal bleeding.  Tolerated her prep well.  She takes no aspirin or blood thinning agent.    Current Facility-Administered Medications:  .  0.9 %  sodium chloride infusion, , Intravenous, Continuous, Sandra Sails, MD, Last Rate: 20 mL/hr at 12/16/18 1246, 1,000 mL at 12/16/18 1246  Medications Prior to Admission  Medication Sig Dispense Refill Last Dose  . Adalimumab (HUMIRA) 40 MG/0.8ML PSKT Inject 40 mg into the skin every 14 (fourteen) days.    Past Week at Unknown time  . albuterol (PROVENTIL) (2.5 MG/3ML) 0.083% nebulizer solution Take 3 mLs (2.5 mg total) by nebulization every 6 (six) hours as needed for wheezing or shortness of breath. 150 mL 1 Past Month at Unknown time  . ALPRAZolam (XANAX) 0.25 MG tablet Take 1 tablet (0.25 mg total) by mouth daily as needed for anxiety. 15 tablet 0 Past Week at Unknown time  . ANORO ELLIPTA 62.5-25 MCG/INH AEPB TAKE 1 PUFF BY MOUTH EVERY DAY 180 each 3 Past Week at Unknown time  . COD LIVER OIL PO Take 1 capsule by mouth daily.    Past Week at Unknown time  . folic acid (FOLVITE) 1 MG tablet TAKE 1 TABLET BY MOUTH EVERY DAY 100 tablet 0 Past Week at Unknown time  . methotrexate (RHEUMATREX) 2.5 MG tablet Take 5-7.5 mg by mouth See admin instructions. Take 5-7.5 mg by mouth two times a week- 5 mg on Sundays and 7.5 mg on Saturdays   Past Week at Unknown time  . pantoprazole (PROTONIX) 40 MG tablet TAKE 1 TABLET BY MOUTH EVERY DAY 90 tablet 1 Past Week at  Unknown time  . traMADol (ULTRAM-ER) 100 MG 24 hr tablet Take 1 tablet (100 mg total) by mouth daily. (Patient taking differently: Take 100 mg by mouth daily as needed for pain. ) 30 tablet 3 Past Month at Unknown time     Allergies  Allergen Reactions  . Vit D-Vit E-Safflower Oil Shortness Of Breath and Nausea And Vomiting  . Codeine Rash     Past Medical History:  Diagnosis Date  . Arthritis    "all over my body; I'm eat up w/it" (11/10/2017)  . Asthma   . Choledocholithiasis   . Chronic bronchitis ([place])   . Emphysema of lung ([place])   . GERD (gastroesophageal reflux disease)   . Neuromuscular disorder ([place])   . Osteoporosis   . Pneumonia    "twice I think" (11/10/2017)  . Seasonal allergies   . Wears dentures    full upper    Review of systems:      Physical Exam    Heart and lungs: Regular rate and rhythm without rub or gallop lungs are bilaterally clear    HEENT: Normocephalic atraumatic eyes are anicteric    Other:    Pertinant exam for procedure: Soft nontender nondistended bowel sounds positive normoactive    Planned proceedures: Colonoscopy and indicated procedures. I have discussed the risks benefits and complications of procedures to include not limited to bleeding, infection, perforation and the risk of  sedation and the patient wishes to proceed.    Sandra Sails, MD Gastroenterology 12/16/2018  1:13 PM

## 2018-12-16 NOTE — Op Note (Signed)
[facility] Gastroenterology Patient Name: Sandra Schwartz Procedure Date: 12/16/2018 1:10 PM MRN: [id number] Account #: [id number] Date of Birth: 1945-10-11 Admit Type: Outpatient Age: 73 Room: [facility] ENDO ROOM 3 Gender: Female Note Status: Finalized Procedure:            Colonoscopy Indications:          Personal history of colonic polyps Providers:            Lollie Sails, MD Referring MD:         Real Cons. Sharlet Salina MD, MD (Referring MD) Medicines:            Monitored Anesthesia Care Complications:        No immediate complications. Procedure:            Pre-Anesthesia Assessment:                       - ASA Grade Assessment: III - A patient with severe                        systemic disease.                       After obtaining informed consent, the colonoscope was                        passed under direct vision. Throughout the procedure,                        the patient's blood pressure, pulse, and oxygen                        saturations were monitored continuously. The                        Colonoscope was introduced through the anus and                        advanced to the the cecum, identified by appendiceal                        orifice and ileocecal valve. The colonoscopy was                        performed without difficulty. The patient tolerated the                        procedure well. The quality of the bowel preparation                        was good. Findings:      A 2 mm polyp was found in the sigmoid colon. The polyp was sessile. The       polyp was removed with a cold biopsy forceps. Resection and retrieval       were complete.      A 15 mm polyp was found in the mid descending colon. The polyp was       sessile. The polyp was removed with a cold snare. Resection and       retrieval were complete.      A 11 mm polyp was found in the proximal ascending colon. The polyp was  sessile. The polyp was removed with a cold  snare. Resection and       retrieval were complete.      There was evidence of a prior functional end-to-end colo-colonic       anastomosis at 15 cm proximal to the anus. This was patent and was       characterized by healthy appearing mucosa. The anastomosis was traversed.      A few small-mouthed diverticula were found in the sigmoid colon and       descending colon.      The retroflexed view of the distal rectum and anal verge was normal and       showed no anal or rectal abnormalities. Impression:           - One 2 mm polyp in the sigmoid colon, removed with a                        cold biopsy forceps. Resected and retrieved.                       - One 15 mm polyp in the mid descending colon, removed                        with a cold snare. Resected and retrieved.                       - One 11 mm polyp in the proximal ascending colon,                        removed with a cold snare. Resected and retrieved.                       - Patent functional end-to-end colo-colonic                        anastomosis, characterized by healthy appearing mucosa.                       - Diverticulosis in the sigmoid colon and in the                        descending colon.                       - The distal rectum and anal verge are normal on                        retroflexion view. Recommendation:       - Discharge patient to home.                       - Soft diet today, then advance as tolerated to advance                        diet as tolerated. Procedure Code(s):    --- Professional ---                       [phone], Colonoscopy, flexible; with removal of tumor(s),  polyp(s), or other lesion(s) by snare technique                       [id number], 59, Colonoscopy, flexible; with biopsy, single                        or multiple Diagnosis Code(s):    --- Professional ---                       K63.5, Polyp of colon                       Z98.0, Intestinal bypass and  anastomosis status                       Z86.010, Personal history of colonic polyps                       K57.30, Diverticulosis of large intestine without                        perforation or abscess without bleeding CPT copyright 2019 American Medical Association. All rights reserved. The codes documented in this report are preliminary and upon coder review may  be revised to meet current compliance requirements. Lollie Sails, MD 12/16/2018 1:56:55 PM This report has been signed electronically. Number of Addenda: 0 Note Initiated On: 12/16/2018 1:10 PM Scope Withdrawal Time: 0 hours 10 minutes 57 seconds  Total Procedure Duration: 0 hours 27 minutes 26 seconds       [facility]

## 2018-12-16 NOTE — Anesthesia Preprocedure Evaluation (Signed)
Anesthesia Evaluation  Patient identified by MRN, date of birth, ID band Patient awake    Reviewed: Allergy & Precautions, H&P , NPO status , Patient's Chart, lab work & pertinent test results  History of Anesthesia Complications Negative for: history of anesthetic complications  Airway Mallampati: III  TM Distance: <3 FB Neck ROM: limited    Dental  (+) Poor Dentition, Missing   Pulmonary asthma , pneumonia, COPD, former smoker,           Cardiovascular Exercise Tolerance: Good hypertension, (-) angina(-) Past MI and (-) DOE      Neuro/Psych PSYCHIATRIC DISORDERS  Neuromuscular disease    GI/Hepatic Neg liver ROS, GERD  Medicated and Controlled,  Endo/Other  negative endocrine ROS  Renal/GU negative Renal ROS  negative genitourinary   Musculoskeletal  (+) Arthritis ,   Abdominal   Peds  Hematology negative hematology ROS (+)   Anesthesia Other Findings Past Medical History: No date: Arthritis     Comment:  "all over my body; I'm eat up w/it" (11/10/2017) No date: Asthma No date: Choledocholithiasis No date: Chronic bronchitis ([facility]) No date: Emphysema of lung ([facility]) No date: GERD (gastroesophageal reflux disease) No date: Neuromuscular disorder ([place]) No date: Osteoporosis No date: Pneumonia     Comment:  "twice I think" (11/10/2017) No date: Seasonal allergies No date: Wears dentures     Comment:  full upper  Past Surgical History: ~ 1988: APPENDECTOMY 1987: BREAST BIOPSY; Left     Comment:  excisional 06/16/2016: CATARACT EXTRACTION W/PHACO; Left     Comment:  Procedure: CATARACT EXTRACTION PHACO AND INTRAOCULAR               LENS PLACEMENT ([place])  Left;  Surgeon: Eulogio Bear,               MD;  Location: [place];  Service:               Ophthalmology;  Laterality: Left;  Left eye IVA Topical 07/07/2016: CATARACT EXTRACTION W/PHACO; Right     Comment:  Procedure: CATARACT EXTRACTION  PHACO AND INTRAOCULAR               LENS PLACEMENT ([place])  Right;  Surgeon: Eulogio Bear,              MD;  Location: [place];  Service:               Ophthalmology;  Laterality: Right; 11/12/2017: CHOLECYSTECTOMY; N/A     Comment:  Procedure: LAPAROSCOPIC CHOLECYSTECTOMY;  Surgeon:               Erroll Luna, MD;  Location: [place];  Service: General;              Laterality: N/A; ~ 2008: COLECTOMY 11/11/2017: ENDOSCOPIC RETROGRADE CHOLANGIOPANCREATOGRAPHY (ERCP) WITH  PROPOFOL; N/A     Comment:  Procedure: ENDOSCOPIC RETROGRADE               CHOLANGIOPANCREATOGRAPHY (ERCP) WITH PROPOFOL;  Surgeon:               Ladene Artist, MD;  Location: [facility] ENDOSCOPY;  Service:               Endoscopy;  Laterality: N/A; 11/11/2017: REMOVAL OF STONES     Comment:  Procedure: REMOVAL OF STONES;  Surgeon: Ladene Artist, MD;  Location: [place];  Service:  Endoscopy;; ~ 1988: SALPINGOOPHORECTOMY No date: SMALL INTESTINE SURGERY     Comment:  "in [place]" 11/11/2017: SPHINCTEROTOMY     Comment:  Procedure: SPHINCTEROTOMY;  Surgeon: Ladene Artist,               MD;  Location: [facility] ENDOSCOPY;  Service: Endoscopy;;  BMI    Body Mass Index: 31.18 kg/m      Reproductive/Obstetrics negative OB ROS                             Anesthesia Physical Anesthesia Plan  ASA: III  Anesthesia Plan: General   Post-op Pain Management:    Induction: Intravenous  PONV Risk Score and Plan: Propofol infusion and TIVA  Airway Management Planned: Natural Airway and Nasal Cannula  Additional Equipment:   Intra-op Plan:   Post-operative Plan:   Informed Consent: I have reviewed the patients History and Physical, chart, labs and discussed the procedure including the risks, benefits and alternatives for the proposed anesthesia with the patient or authorized representative who has indicated his/her understanding and acceptance.     Dental Advisory  Given  Plan Discussed with: Anesthesiologist, CRNA and Surgeon  Anesthesia Plan Comments: (Patient consented for risks of anesthesia including but not limited to:  - adverse reactions to medications - risk of intubation if required - damage to teeth, lips or other oral mucosa - sore throat or hoarseness - Damage to heart, brain, lungs or loss of life  Patient voiced understanding.)        Anesthesia Quick Evaluation

## 2018-12-16 NOTE — Transfer of Care (Signed)
Immediate Anesthesia Transfer of Care Note  Patient: Sandra Schwartz  Procedure(s) Performed: COLONOSCOPY (N/A )  Patient Location: PACU  Anesthesia Type:General  Level of Consciousness: sedated  Airway & Oxygen Therapy: Patient Spontanous Breathing and Patient connected to nasal cannula oxygen  Post-op Assessment: Report given to RN and Post -op Vital signs reviewed and stable  Post vital signs: Reviewed and stable  Last Vitals:  Vitals Value Taken Time  BP    Temp    Pulse    Resp    SpO2      Last Pain:  Vitals:   12/16/18 1229  TempSrc: Tympanic  PainSc: 0-No pain         Complications: No apparent anesthesia complications

## 2018-12-16 NOTE — Anesthesia Post-op Follow-up Note (Signed)
Anesthesia QCDR form completed.        

## 2018-12-16 NOTE — Anesthesia Postprocedure Evaluation (Signed)
Anesthesia Post Note  Patient: Sandra Schwartz  Procedure(s) Performed: COLONOSCOPY (N/A )  Patient location during evaluation: Endoscopy Anesthesia Type: General Level of consciousness: awake and alert Pain management: pain level controlled Vital Signs Assessment: post-procedure vital signs reviewed and stable Respiratory status: spontaneous breathing, nonlabored ventilation and respiratory function stable Cardiovascular status: blood pressure returned to baseline and stable Postop Assessment: no apparent nausea or vomiting Anesthetic complications: no     Last Vitals:  Vitals:   12/16/18 1405 12/16/18 1410  BP: (!) 146/69 (!) 160/80  Pulse: (!) 57   Resp: 20   Temp:    SpO2: 100%     Last Pain:  Vitals:   12/16/18 1405  TempSrc:   PainSc: 0-No pain                 Alphonsus Sias

## 2018-12-20 LAB — SURGICAL PATHOLOGY

## 2018-12-29 DIAGNOSIS — Z79899 Other long term (current) drug therapy: Secondary | ICD-10-CM | POA: Diagnosis not present

## 2018-12-29 DIAGNOSIS — M059 Rheumatoid arthritis with rheumatoid factor, unspecified: Secondary | ICD-10-CM | POA: Diagnosis not present

## 2018-12-29 DIAGNOSIS — M2041 Other hammer toe(s) (acquired), right foot: Secondary | ICD-10-CM | POA: Diagnosis not present

## 2018-12-29 DIAGNOSIS — M2042 Other hammer toe(s) (acquired), left foot: Secondary | ICD-10-CM | POA: Diagnosis not present

## 2019-01-05 DIAGNOSIS — D729 Disorder of white blood cells, unspecified: Secondary | ICD-10-CM | POA: Diagnosis not present

## 2019-01-12 DIAGNOSIS — D729 Disorder of white blood cells, unspecified: Secondary | ICD-10-CM | POA: Diagnosis not present

## 2019-02-02 DIAGNOSIS — D729 Disorder of white blood cells, unspecified: Secondary | ICD-10-CM | POA: Diagnosis not present

## 2019-04-03 ENCOUNTER — Other Ambulatory Visit: Payer: Self-pay | Admitting: Internal Medicine

## 2019-07-04 ENCOUNTER — Other Ambulatory Visit: Payer: Self-pay | Admitting: Internal Medicine

## 2019-09-12 ENCOUNTER — Other Ambulatory Visit: Payer: Self-pay | Admitting: Internal Medicine

## 2019-09-12 DIAGNOSIS — Z1231 Encounter for screening mammogram for malignant neoplasm of breast: Secondary | ICD-10-CM

## 2019-10-26 IMAGING — CR DG CHEST 2V
2 series · 2 of 2 positions shown · non-contrast
Comparison: 10/08/2017.

CLINICAL DATA: Chest pain.  Shortness of breath.

EXAM:
CHEST - 2 VIEW

[chest lat]
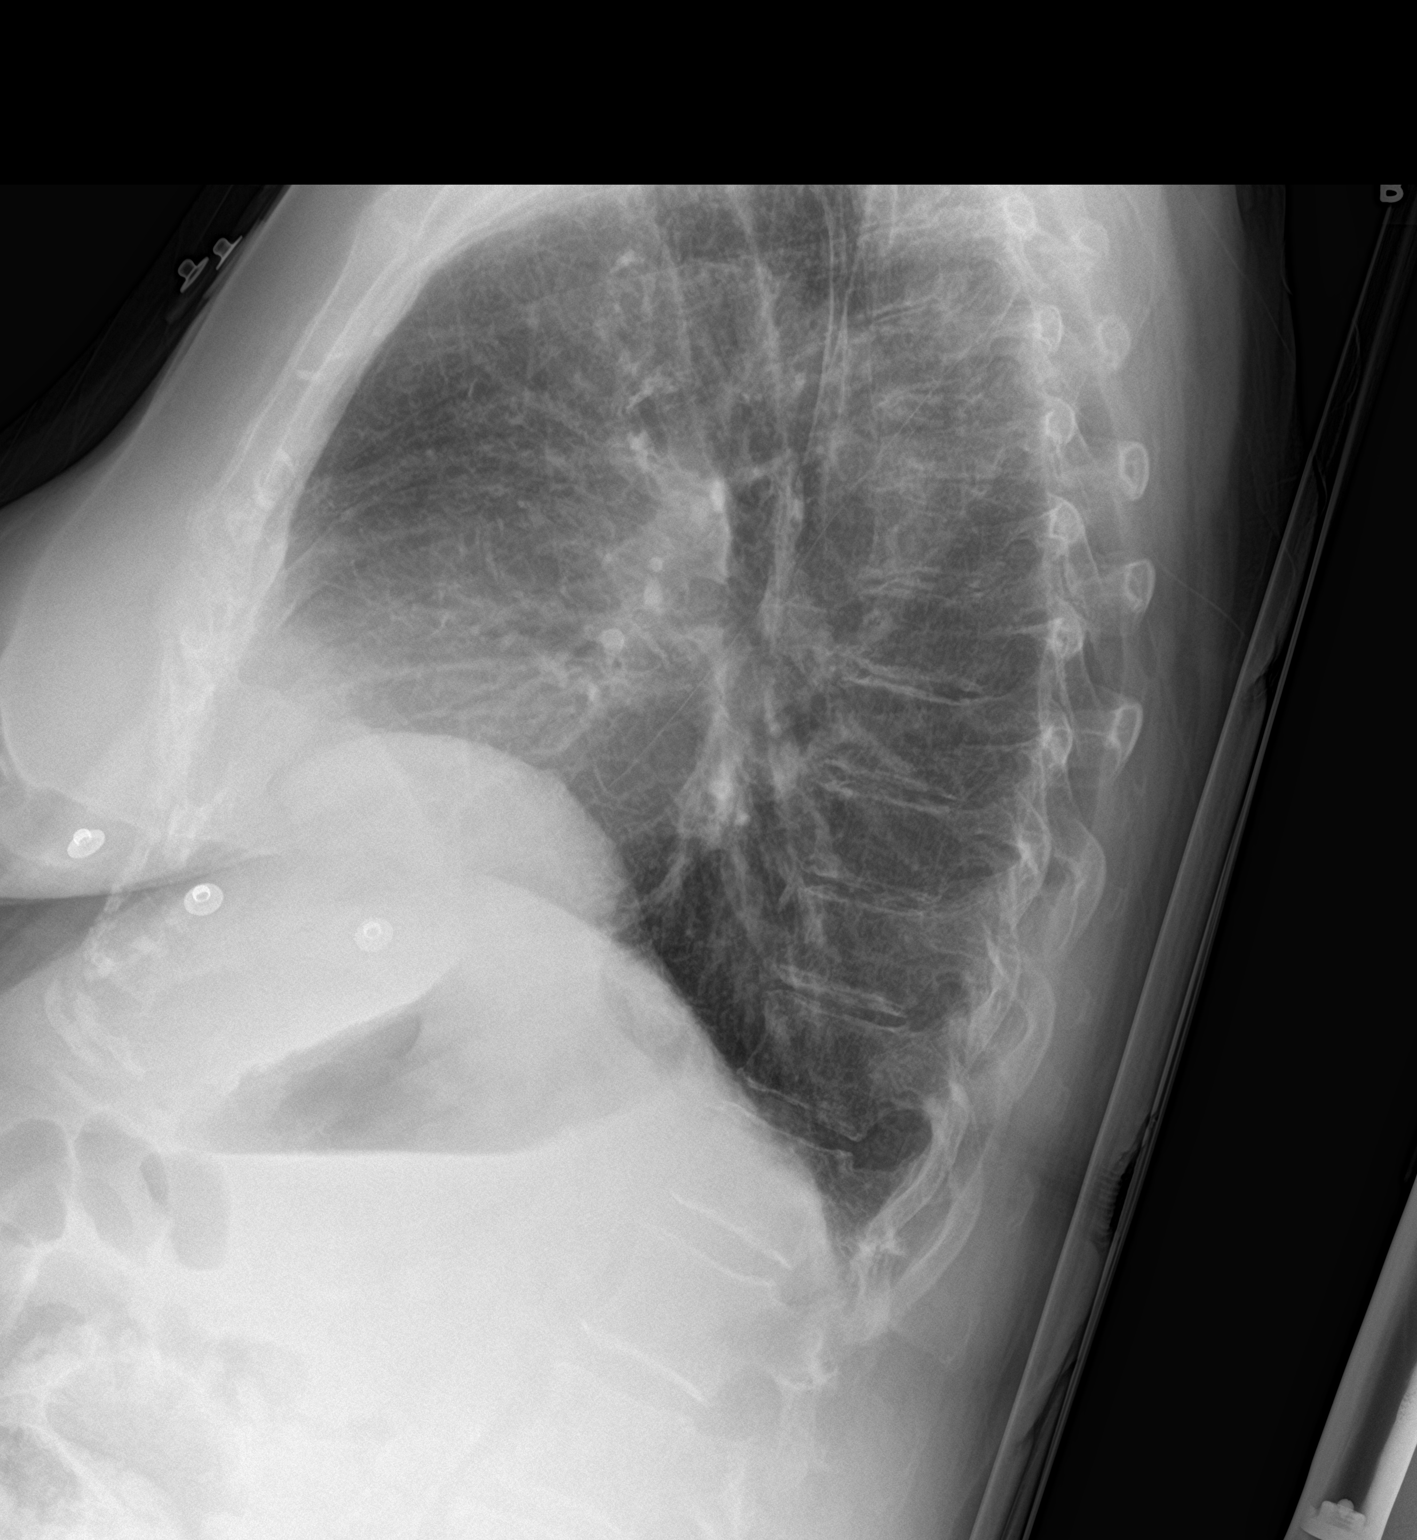

[chest ap]
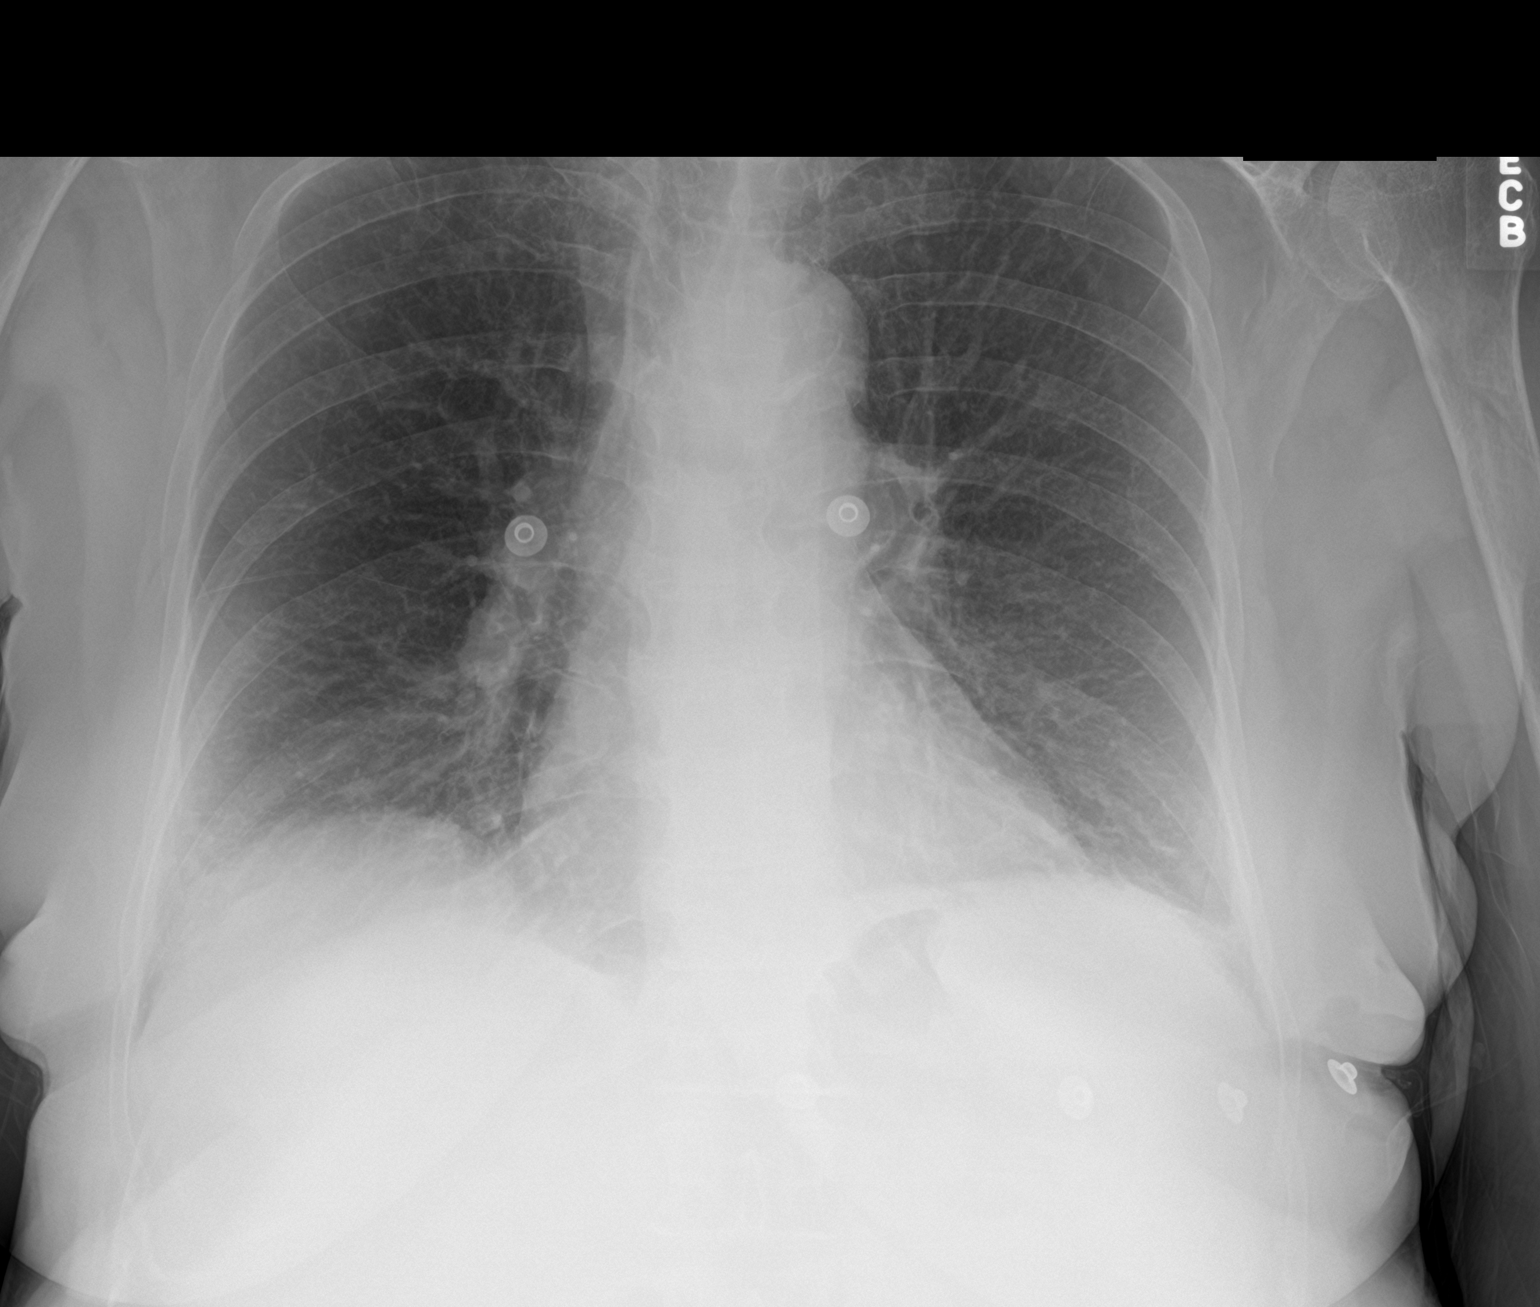

[2 of 2 positions shown; findings below may reference images not displayed]

FINDINGS: Mediastinum hilar structures normal. Low lung volumes with mild
bibasilar atelectasis/infiltrates. No pleural effusion or
pneumothorax. Biapical pleural thickening noted consistent scarring.
No acute bony abnormality.
IMPRESSION: Low lung volumes with mild bibasilar atelectasis/infiltrates.

## 2019-10-26 IMAGING — MR MR 3D RECON AT [facility]
18 of 22 series · 44 of 48 positions shown · IV contrast (multihance)
Comparison: Ultrasound abdomen 11/09/2017

CLINICAL DATA: Abnormal liver functions. Worsening abdominal pain,
nausea, and vomiting. Abdominal ultrasound demonstrated
choledocholithiasis.

EXAM:
MRI ABDOMEN WITHOUT AND WITH CONTRAST (INCLUDING MRCP)
TECHNIQUE: Multiplanar multisequence MR imaging of the abdomen was performed
both before and after the administration of intravenous contrast.
Heavily T2-weighted images of the biliary and pancreatic ducts were
obtained, and three-dimensional MRCP images were rendered by post
processing.
CONTRAST:  15mL MULTIHANCE GADOBENATE DIMEGLUMINE 529 MG/ML IV SOLN

[Series 4: ax haste · axial · 6.0mm · 1.19mm/px · 1 of 35 slices shown]
[im 1/35]
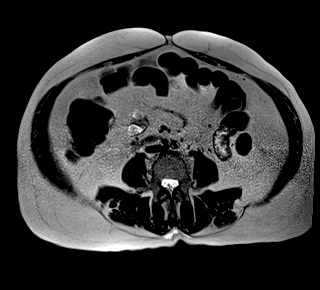

[Series 5: bSSFP · coronal · 6.0mm · 0.74mm/px · 1 of 35 slices shown]
[im 1/35]
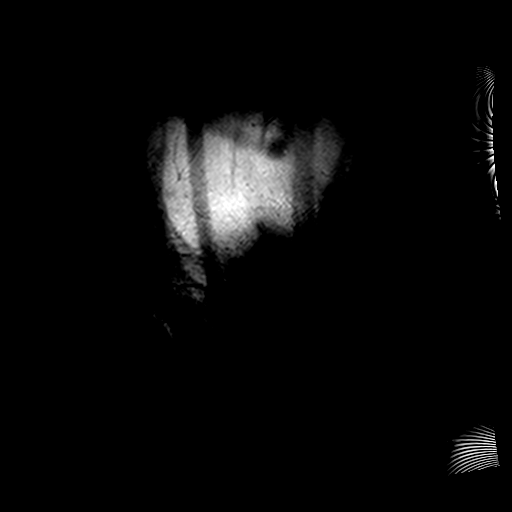

[Series 8: T2 fat-sat · axial · 6.0mm · 1.19mm/px · 1 of 35 slices shown]
[im 1/35]
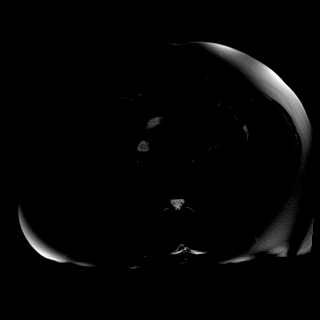

[Series 11: DWI · axial · 6.0mm · 1.42mm/px · z∈[-223,+21]mm · 3 of 105 slices shown (1 of 2)]
[im 1/105]
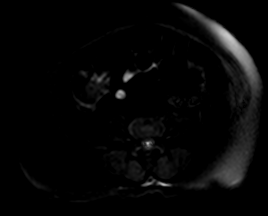
[im 53/105]
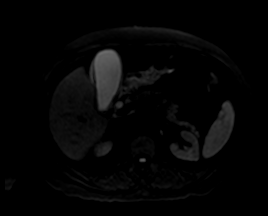
[im 105/105]
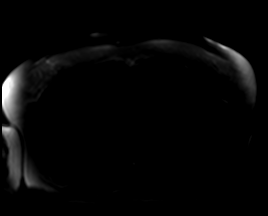

[Series 12: DWI · axial · 6.0mm · 1.42mm/px · 1 of 35 slices shown (2 of 2)]
[im 1/35]
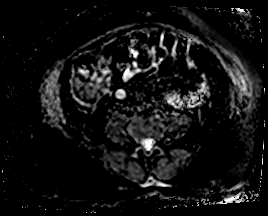

[Series 13: MRCP · coronal · 4.0mm · 1.12mm/px · 1 of 15 slices shown]
[im 1/15]
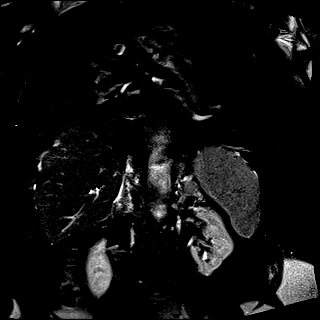

[Series 14: radials · coronal · 50.0mm · 0.78mm/px · 1 of 5 slices shown]
[im 1/5]
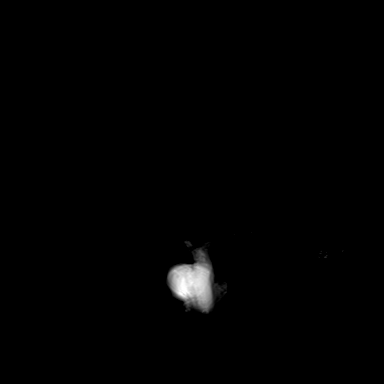

[Series 19: ax in and · axial · 3.0mm · 1.19mm/px · z∈[-204,+33]mm · 5 of 160 slices shown]
[im 1/160]
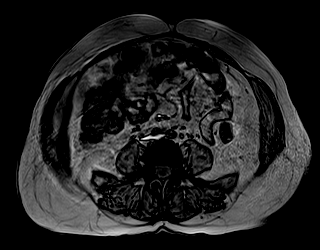
[im 40/160]
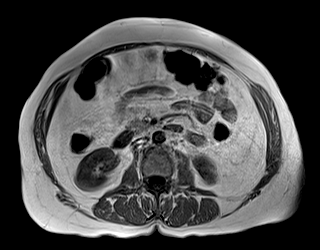
[im 80/160]
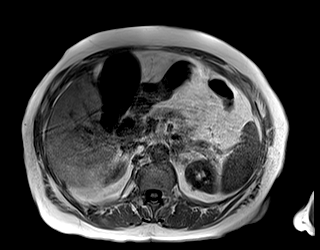
[im 120/160]
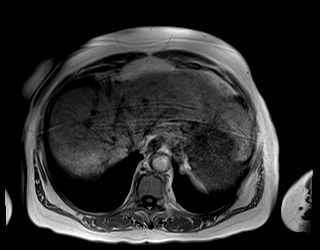
[im 160/160]
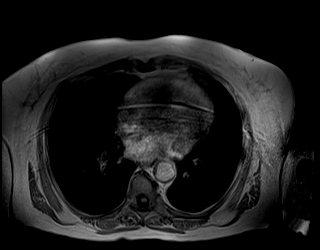

[Series 20: T1 dynamic · axial · non-contrast · 3.0mm · 1.19mm/px · z∈[-205,+32]mm · 3 of 80 slices shown (1 of 5)]
[im 1/80]
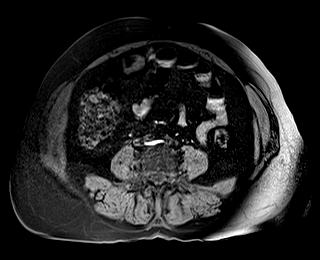
[im 40/80]
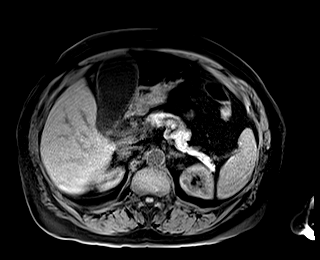
[im 80/80]
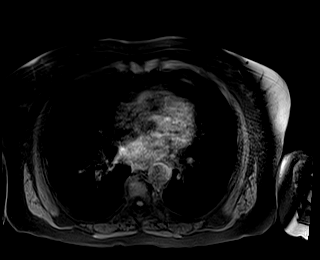

[Series 22: T1 dynamic post-contrast · axial · 3.0mm · 1.19mm/px · z∈[-205,+32]mm · 3 of 80 slices shown (1 of 5)]
[im 1/80]
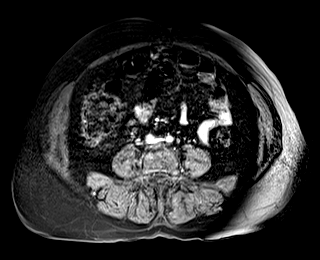
[im 40/80]
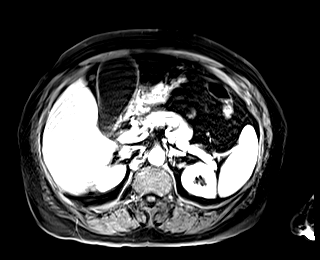
[im 80/80]
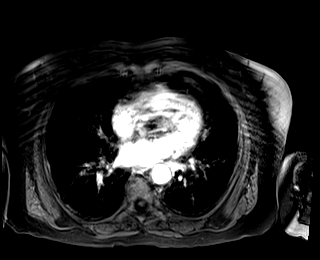

[Series 23: T1 dynamic · axial · 3.0mm · 1.19mm/px · z∈[-205,+32]mm · 3 of 80 slices shown (2 of 5)]
[im 1/80]
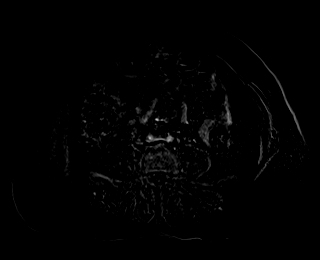
[im 40/80]
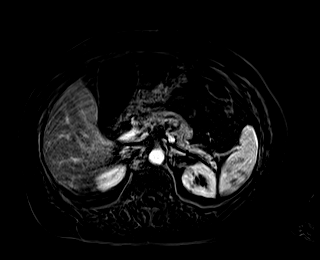
[im 80/80]
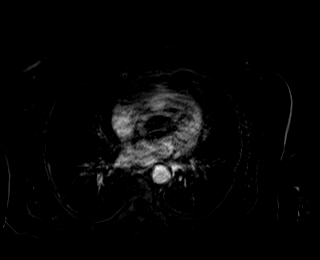

[Series 24: T1 dynamic post-contrast · axial · 3.0mm · 1.19mm/px · z∈[-205,+32]mm · 3 of 80 slices shown (2 of 5)]
[im 1/80]
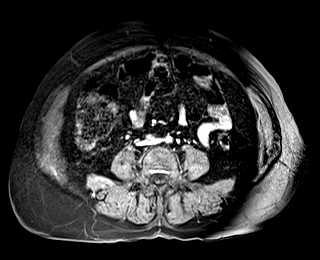
[im 40/80]
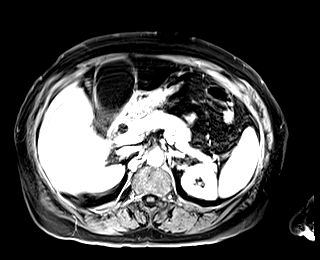
[im 80/80]
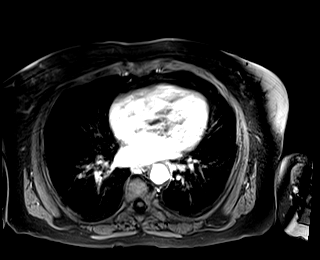

[Series 25: T1 dynamic · axial · 3.0mm · 1.19mm/px · z∈[-205,+32]mm · 3 of 80 slices shown (3 of 5)]
[im 1/80]
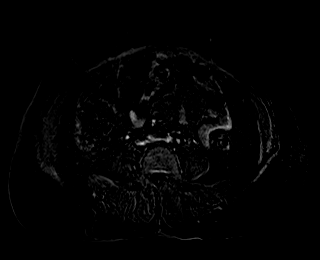
[im 40/80]
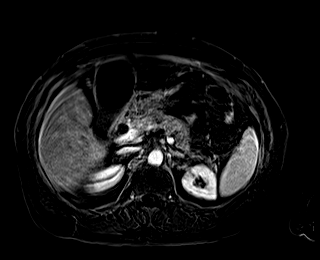
[im 80/80]
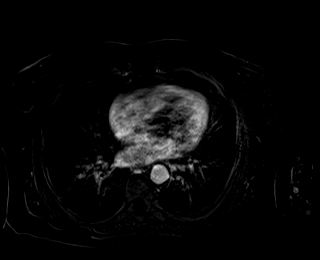

[Series 26: T1 dynamic post-contrast · axial · 3.0mm · 1.19mm/px · z∈[-205,+32]mm · 3 of 80 slices shown (3 of 5)]
[im 1/80]
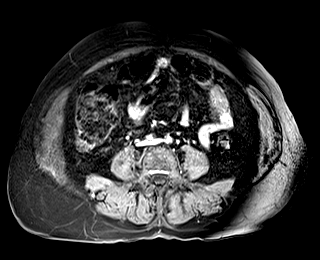
[im 40/80]
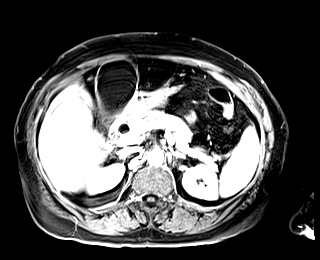
[im 80/80]
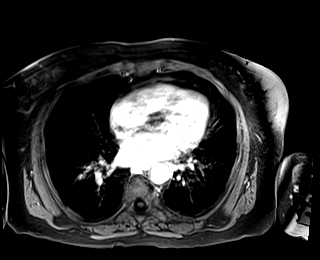

[Series 27: T1 dynamic · axial · 3.0mm · 1.19mm/px · z∈[-205,+32]mm · 3 of 80 slices shown (4 of 5)]
[im 1/80]
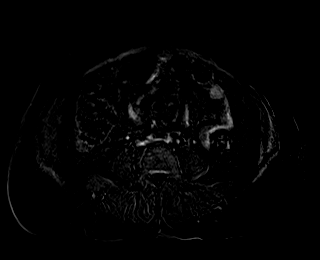
[im 40/80]
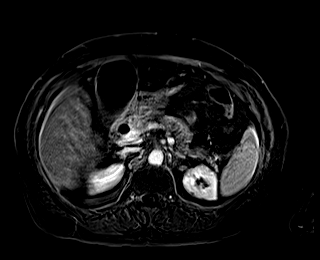
[im 80/80]
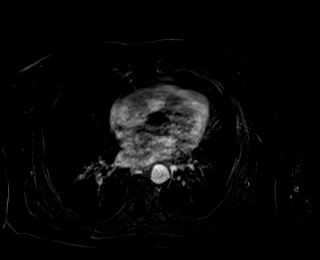

[Series 28: T1 dynamic post-contrast · axial · 3.0mm · 1.19mm/px · z∈[-205,+32]mm · 3 of 80 slices shown (4 of 5)]
[im 1/80]
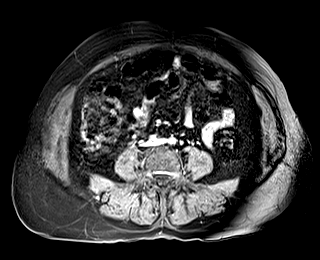
[im 40/80]
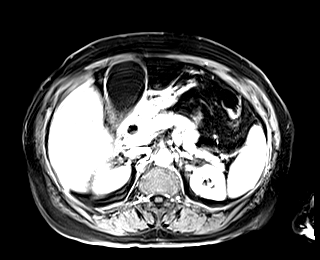
[im 80/80]
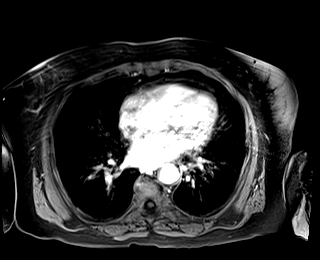

[Series 29: T1 dynamic · axial · 3.0mm · 1.19mm/px · z∈[-205,+32]mm · 3 of 80 slices shown (5 of 5)]
[im 1/80]
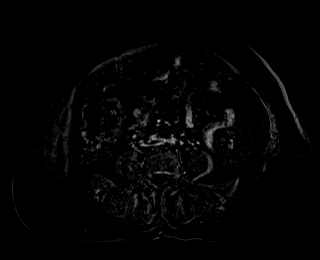
[im 40/80]
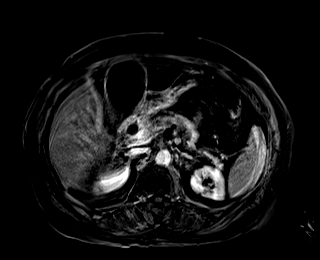
[im 80/80]
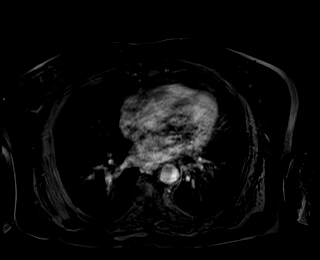

[Series 30: T1 dynamic post-contrast · coronal · 3.0mm · 1.25mm/px · 3 of 72 slices shown (5 of 5)]
[im 1/72]
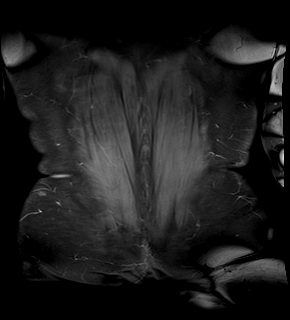
[im 36/72]
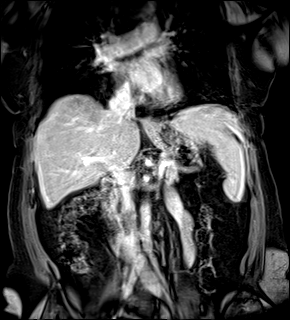
[im 72/72]
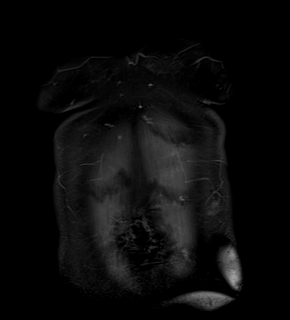

[44 of 48 positions shown; findings below may reference images not displayed]

FINDINGS: Motion artifact limits examination.

Lower chest: Lung bases are clear.

Hepatobiliary: Liver demonstrates a few tiny subcentimeter T2
hyperintense lesions consistent with small cyst. No solid focal
lesions identified. No enhancing liver lesions identified. The
gallbladder is mildly distended with small stones in the gallbladder
neck. There is mild pericholecystic edema. Changes suggest mild
acute cholecystitis. There is mild intra and moderate extrahepatic
bile duct dilatation with common bile duct measuring up to about 14
mm in diameter. Focal filling defects are demonstrated in the mid
and distal common bile duct consistent with common duct stones.

Pancreas: No mass, inflammatory changes, or other parenchymal
abnormality identified.

Spleen:  Within normal limits in size and appearance.

Adrenals/Urinary Tract: Left adrenal gland nodule measuring 2.4 cm
maximal dimension. There is significant signal loss within the
lesion between in and out of phase images consistent with fat
content. Appearance is consistent with a benign adenoma.
Subcentimeter parenchymal cysts on the left kidney. No
hydronephrosis or hydroureter.

Stomach/Bowel: Visualized stomach, small bowel, and colon
demonstrate no evidence of dilatation, wall thickening, or
inflammatory change.

Vascular/Lymphatic: Normal caliber abdominal aorta. No aneurysm.
Inferior vena cava is patent. No significant lymphadenopathy.

Other: No free air or free fluid demonstrated in the visualized
abdomen.

Musculoskeletal: No suspicious bone lesions identified.
IMPRESSION: 1. Distended gallbladder with cholelithiasis and mild
pericholecystic edema suggesting acute cholecystitis.
2. Dilated intra and extrahepatic bile ducts with filling defects in
the mid and distal common bile duct consistent with
choledocholithiasis.
3. Left adrenal gland nodule has imaging characteristics consistent
with benign adenoma.
4. Small subcentimeter cysts in the liver and left kidney.
